# Patient Record
Sex: Female | Born: 1985 | Race: White | Hispanic: No | Marital: Married | Smoking: Former smoker
Health system: Southern US, Community
[De-identification: ages and names within clinical notes are randomized; demographics above are authoritative.]

## PROBLEM LIST (undated history)

## (undated) DIAGNOSIS — D6800 Von Willebrand disease, unspecified: Secondary | ICD-10-CM

## (undated) DIAGNOSIS — F32A Depression, unspecified: Secondary | ICD-10-CM

## (undated) DIAGNOSIS — F329 Major depressive disorder, single episode, unspecified: Secondary | ICD-10-CM

## (undated) HISTORY — DX: Von Willebrand disease, unspecified: D68.00

## (undated) HISTORY — DX: Von Willebrand's disease: D68.0

## (undated) HISTORY — PX: TONSILLECTOMY: SUR1361

## (undated) HISTORY — DX: Major depressive disorder, single episode, unspecified: F32.9

## (undated) HISTORY — DX: Depression, unspecified: F32.A

---
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
## 2004-11-29 ENCOUNTER — Inpatient Hospital Stay (HOSPITAL_COMMUNITY): Admission: AD | Admit: 2004-11-29 | Discharge: 2004-11-29 | Payer: Self-pay | Admitting: Obstetrics & Gynecology

## 2005-03-14 ENCOUNTER — Other Ambulatory Visit: Admission: RE | Admit: 2005-03-14 | Discharge: 2005-03-14 | Payer: Self-pay | Admitting: Gynecology

## 2005-08-10 ENCOUNTER — Other Ambulatory Visit: Admission: RE | Admit: 2005-08-10 | Discharge: 2005-08-10 | Payer: Self-pay | Admitting: Obstetrics and Gynecology

## 2005-09-04 ENCOUNTER — Ambulatory Visit: Payer: Self-pay | Admitting: Hematology & Oncology

## 2005-10-26 ENCOUNTER — Ambulatory Visit: Payer: Self-pay | Admitting: Hematology & Oncology

## 2005-10-27 LAB — IVY BLEEDING TIME: Bleeding Time: 5.5 Minutes (ref 2.0–8.0)

## 2005-11-01 LAB — VON WILLEBRAND FACTOR MULTIMER
Ristocetin-Cofactor: 87 % (ref 50–150)
Von Willebrand Ag: 157 % normal — ABNORMAL HIGH (ref 60–150)
Von Willebrand Multimers: NORMAL

## 2006-02-01 ENCOUNTER — Ambulatory Visit: Payer: Self-pay | Admitting: Hematology & Oncology

## 2006-02-05 LAB — CBC WITH DIFFERENTIAL/PLATELET
BASO%: 0.2 % (ref 0.0–2.0)
Basophils Absolute: 0 10*3/uL (ref 0.0–0.1)
EOS%: 0.9 % (ref 0.0–7.0)
Eosinophils Absolute: 0.1 10*3/uL (ref 0.0–0.5)
HCT: 35.5 % (ref 34.8–46.6)
HGB: 12.1 g/dL (ref 11.6–15.9)
LYMPH%: 13.9 % — ABNORMAL LOW (ref 14.0–48.0)
MCH: 27.8 pg (ref 26.0–34.0)
MONO#: 0.8 10*3/uL (ref 0.1–0.9)
MONO%: 8.6 % (ref 0.0–13.0)
NEUT#: 6.7 10*3/uL — ABNORMAL HIGH (ref 1.5–6.5)
NEUT%: 76.4 % (ref 39.6–76.8)
Platelets: 271 10*3/uL (ref 145–400)
RBC: 4.35 10*6/uL (ref 3.70–5.32)
lymph#: 1.2 10*3/uL (ref 0.9–3.3)

## 2006-02-05 LAB — CHCC SMEAR

## 2006-02-09 LAB — APTT: aPTT: 29 seconds (ref 24–37)

## 2006-03-02 ENCOUNTER — Inpatient Hospital Stay (HOSPITAL_COMMUNITY): Admission: AD | Admit: 2006-03-02 | Discharge: 2006-03-04 | Payer: Self-pay | Admitting: Obstetrics and Gynecology

## 2006-05-03 ENCOUNTER — Ambulatory Visit: Payer: Self-pay | Admitting: Hematology & Oncology

## 2006-05-07 LAB — CBC WITH DIFFERENTIAL/PLATELET
BASO%: 0.3 % (ref 0.0–2.0)
HGB: 13.2 g/dL (ref 11.6–15.9)
LYMPH%: 20.6 % (ref 14.0–48.0)
MCV: 81.8 fL (ref 81.0–101.0)
Platelets: 290 10*3/uL (ref 145–400)
RDW: 15 % — ABNORMAL HIGH (ref 11.3–14.5)
WBC: 6.7 10*3/uL (ref 3.9–10.0)
lymph#: 1.4 10*3/uL (ref 0.9–3.3)

## 2006-05-12 LAB — VON WILLEBRAND FACTOR MULTIMER

## 2006-11-24 ENCOUNTER — Emergency Department (HOSPITAL_COMMUNITY): Admission: EM | Admit: 2006-11-24 | Discharge: 2006-11-24 | Payer: Self-pay | Admitting: Family Medicine

## 2009-02-22 ENCOUNTER — Inpatient Hospital Stay (HOSPITAL_COMMUNITY): Admission: AD | Admit: 2009-02-22 | Discharge: 2009-02-22 | Payer: Self-pay | Admitting: Obstetrics and Gynecology

## 2009-03-17 ENCOUNTER — Ambulatory Visit: Payer: Self-pay | Admitting: Oncology

## 2009-03-17 LAB — CBC WITH DIFFERENTIAL/PLATELET
BASO%: 0.2 % (ref 0.0–2.0)
Basophils Absolute: 0 10*3/uL (ref 0.0–0.1)
Eosinophils Absolute: 0 10*3/uL (ref 0.0–0.5)
HCT: 31.8 % — ABNORMAL LOW (ref 34.8–46.6)
HGB: 11.2 g/dL — ABNORMAL LOW (ref 11.6–15.9)
MCH: 28.9 pg (ref 25.1–34.0)
MCHC: 35.2 g/dL (ref 31.5–36.0)
MONO#: 0.4 10*3/uL (ref 0.1–0.9)
NEUT%: 71.9 % (ref 38.4–76.8)
RBC: 3.87 10*6/uL (ref 3.70–5.45)
lymph#: 1.1 10*3/uL (ref 0.9–3.3)

## 2009-03-17 LAB — IRON AND TIBC
%SAT: 14 % — ABNORMAL LOW (ref 20–55)
Iron: 60 ug/dL (ref 42–145)
UIBC: 357 ug/dL

## 2009-03-17 LAB — FERRITIN: Ferritin: 26 ng/mL (ref 10–291)

## 2009-04-15 ENCOUNTER — Ambulatory Visit (HOSPITAL_COMMUNITY): Admission: RE | Admit: 2009-04-15 | Discharge: 2009-04-15 | Payer: Self-pay | Admitting: Obstetrics and Gynecology

## 2009-07-29 ENCOUNTER — Ambulatory Visit: Payer: Self-pay | Admitting: Oncology

## 2009-08-02 LAB — CBC WITH DIFFERENTIAL/PLATELET
EOS%: 0.3 % (ref 0.0–7.0)
Eosinophils Absolute: 0 10*3/uL (ref 0.0–0.5)
HCT: 34.3 % — ABNORMAL LOW (ref 34.8–46.6)
LYMPH%: 15.3 % (ref 14.0–49.7)
MCH: 28.1 pg (ref 25.1–34.0)
MCV: 83.8 fL (ref 79.5–101.0)
RDW: 14.5 % (ref 11.2–14.5)
lymph#: 1.2 10*3/uL (ref 0.9–3.3)

## 2009-08-06 LAB — VON WILLEBRAND PANEL: Von Willebrand Ag: 62 % normal (ref 61–164)

## 2009-09-02 ENCOUNTER — Inpatient Hospital Stay (HOSPITAL_COMMUNITY): Admission: AD | Admit: 2009-09-02 | Discharge: 2009-09-04 | Payer: Self-pay | Admitting: Obstetrics and Gynecology

## 2009-09-05 ENCOUNTER — Ambulatory Visit: Admission: RE | Admit: 2009-09-05 | Discharge: 2009-09-05 | Payer: Self-pay | Admitting: Obstetrics and Gynecology

## 2009-09-07 ENCOUNTER — Inpatient Hospital Stay (HOSPITAL_COMMUNITY): Admission: AD | Admit: 2009-09-07 | Discharge: 2009-09-07 | Payer: Self-pay | Admitting: Obstetrics and Gynecology

## 2009-09-11 ENCOUNTER — Inpatient Hospital Stay (HOSPITAL_COMMUNITY): Admission: AD | Admit: 2009-09-11 | Discharge: 2009-09-12 | Payer: Self-pay | Admitting: Obstetrics and Gynecology

## 2010-02-17 ENCOUNTER — Ambulatory Visit: Payer: Self-pay | Admitting: Internal Medicine

## 2010-02-17 DIAGNOSIS — R635 Abnormal weight gain: Secondary | ICD-10-CM | POA: Insufficient documentation

## 2010-02-17 DIAGNOSIS — E669 Obesity, unspecified: Secondary | ICD-10-CM | POA: Insufficient documentation

## 2010-02-17 LAB — CONVERTED CEMR LAB
ALT: 10 units/L (ref 0–35)
AST: 15 units/L (ref 0–37)
Albumin: 3.9 g/dL (ref 3.5–5.2)
BUN: 12 mg/dL (ref 6–23)
Basophils Absolute: 0 10*3/uL (ref 0.0–0.1)
CO2: 21 meq/L (ref 19–32)
Calcium: 9.1 mg/dL (ref 8.4–10.5)
Eosinophils Absolute: 0.1 10*3/uL (ref 0.0–0.7)
Eosinophils Relative: 1.3 % (ref 0.0–5.0)
GFR calc non Af Amer: 158.66 mL/min (ref >60)
Glucose, Bld: 71 mg/dL (ref 70–99)
HCT: 39.1 % (ref 36.0–46.0)
Lymphocytes Relative: 24.8 % (ref 12.0–46.0)
Lymphs Abs: 1.6 10*3/uL (ref 0.7–4.0)
MCHC: 34.1 g/dL (ref 30.0–36.0)
Neutro Abs: 4.1 10*3/uL (ref 1.4–7.7)
Neutrophils Relative %: 65.3 % (ref 43.0–77.0)
Potassium: 4.1 meq/L (ref 3.5–5.1)
RDW: 12.6 % (ref 11.5–14.6)
Sodium: 139 meq/L (ref 135–145)
T4, Total: 9.6 ug/dL (ref 5.0–12.5)
TSH: 2.32 microintl units/mL (ref 0.35–5.50)
Total Bilirubin: 0.6 mg/dL (ref 0.3–1.2)

## 2010-02-18 ENCOUNTER — Ambulatory Visit: Payer: Self-pay | Admitting: Internal Medicine

## 2010-02-18 DIAGNOSIS — R519 Headache, unspecified: Secondary | ICD-10-CM | POA: Insufficient documentation

## 2010-02-18 DIAGNOSIS — F411 Generalized anxiety disorder: Secondary | ICD-10-CM | POA: Insufficient documentation

## 2010-02-18 DIAGNOSIS — R51 Headache: Secondary | ICD-10-CM

## 2010-08-02 NOTE — Assessment & Plan Note (Signed)
Summary: FU/NWS   Vital Signs:  Patient profile:   25 year old female Menstrual status:  regular Height:      65 inches Weight:      171 pounds BMI:     28.56 O2 Sat:      97 % on Room air Temp:     98.6 degrees F oral Pulse rate:   70 / minute Pulse rhythm:   regular Resp:     16 per minute BP sitting:   104 / 60  (left arm) Cuff size:   large  Vitals Entered By: Rock Nephew CMA (February 18, 2010 3:25 PM)  Nutrition Counseling: Patient's BMI is greater than 25 and therefore counseled on weight management options.  O2 Flow:  Room air  Primary Care Provider:  Yetta Barre   History of Present Illness: She returns to disucss her lab results (all normal) and to talk about weight loss regimen, she offers no complaints today.  Preventive Screening-Counseling & Management  Alcohol-Tobacco     Alcohol drinks/day: <1     Alcohol type: wine     >5/day in last 3 mos: no     Alcohol Counseling: not indicated; use of alcohol is not excessive or problematic     Feels need to cut down: no     Feels annoyed by complaints: no     Feels guilty re: drinking: no     Needs 'eye opener' in am: no     Smoking Status: current     Smoking Cessation Counseling: yes     Smoke Cessation Stage: contemplative     Packs/Day: <0.25     Year Started: 1998     Pack years: 3     Tobacco Counseling: to quit use of tobacco products  Hep-HIV-STD-Contraception     Hepatitis Risk: no risk noted     HIV Risk: no risk noted     STD Risk: no risk noted  Clinical Review Panels:  Diabetes Management   Creatinine:  0.5 (02/17/2010)  CBC   WBC:  6.3 (02/17/2010)   RBC:  4.63 (02/17/2010)   Hgb:  13.3 (02/17/2010)   Hct:  39.1 (02/17/2010)   Platelets:  239.0 (02/17/2010)   MCV  84.5 (02/17/2010)   MCHC  34.1 (02/17/2010)   RDW  12.6 (02/17/2010)   PMN:  65.3 (02/17/2010)   Lymphs:  24.8 (02/17/2010)   Monos:  8.4 (02/17/2010)   Eosinophils:  1.3 (02/17/2010)   Basophil:  0.2  (02/17/2010)  Complete Metabolic Panel   Glucose:  71 (02/17/2010)   Sodium:  139 (02/17/2010)   Potassium:  4.1 (02/17/2010)   Chloride:  107 (02/17/2010)   CO2:  21 (02/17/2010)   BUN:  12 (02/17/2010)   Creatinine:  0.5 (02/17/2010)   Albumin:  3.9 (02/17/2010)   Total Protein:  7.0 (02/17/2010)   Calcium:  9.1 (02/17/2010)   Total Bili:  0.6 (02/17/2010)   Alk Phos:  67 (02/17/2010)   SGPT (ALT):  10 (02/17/2010)   SGOT (AST):  15 (02/17/2010)   Problems Prior to Update: 1)  Family History Depression  (ICD-V17.0) 2)  Headache  (ICD-784.0) 3)  Anxiety  (ICD-300.00) 4)  Obesity  (ICD-278.00) 5)  Weight Gain  (ICD-783.1)  Medications Prior to Update: 1)  Bcp  Current Medications (verified): 1)  Bcp  Allergies (verified): No Known Drug Allergies  Past History:  Past Medical History: Last updated: 02/17/2010 Anxiety Headache VonWillebrand bleeding disorder  Past Surgical History: Last updated: 02/17/2010 Tonsillectomy  Family History: Last updated: 02/17/2010 Family History Depression Family History High cholesterol Family History Hypertension  Social History: Last updated: 02/17/2010 Occupation: Special Ed. Teacher Married Current Smoker Alcohol use-yes Drug use-no Regular exercise-yes  Risk Factors: Alcohol Use: <1 (02/18/2010) >5 drinks/d w/in last 3 months: no (02/18/2010) Exercise: yes (02/17/2010)  Risk Factors: Smoking Status: current (02/18/2010) Packs/Day: <0.25 (02/18/2010)  Family History: Reviewed history from 02/17/2010 and no changes required. Family History Depression Family History High cholesterol Family History Hypertension  Social History: Reviewed history from 02/17/2010 and no changes required. Occupation: Special Ed. Teacher Married Current Smoker Alcohol use-yes Drug use-no Regular exercise-yes  Review of Systems       The patient complains of weight gain.  The patient denies anorexia, weight loss, chest  pain, abdominal pain, hematuria, difficulty walking, depression, and enlarged lymph nodes.    Physical Exam  General:  alert, well-developed, well-nourished, well-hydrated, appropriate dress, normal appearance, healthy-appearing, cooperative to examination, and good hygiene.   Mouth:  good dentition, no gingival abnormalities, no dental plaque, and pharynx pink and moist.   Neck:  supple, full ROM, no masses, no thyromegaly, no JVD, normal carotid upstroke, no carotid bruits, and no cervical lymphadenopathy.   Lungs:  normal respiratory effort, no intercostal retractions, no accessory muscle use, normal breath sounds, no dullness, no fremitus, no crackles, and no wheezes.   Heart:  normal rate, regular rhythm, no murmur, no gallop, no rub, and no JVD.   Abdomen:  soft, non-tender, normal bowel sounds, no distention, no masses, no guarding, no rigidity, no rebound tenderness, no abdominal hernia, no inguinal hernia, no hepatomegaly, and no splenomegaly.   Msk:  normal ROM, no joint tenderness, no joint swelling, no joint warmth, no redness over joints, no joint deformities, no joint instability, and no crepitation.   Extremities:  No clubbing, cyanosis, edema, or deformity noted with normal full range of motion of all joints.   Neurologic:  No cranial nerve deficits noted. Station and gait are normal. Plantar reflexes are down-going bilaterally. DTRs are symmetrical throughout. Sensory, motor and coordinative functions appear intact. Skin:  turgor normal, color normal, no rashes, no suspicious lesions, no ecchymoses, no petechiae, no purpura, no ulcerations, and no edema.   Psych:  Cognition and judgment appear intact. Alert and cooperative with normal attention span and concentration. No apparent delusions, illusions, hallucinations   Impression & Recommendations:  Problem # 1:  OBESITY (ICD-278.00) Assessment Unchanged  nutrition consult and exercise  Ht: 65 (02/18/2010)   Wt: 171  (02/18/2010)   BMI: 28.56 (02/18/2010)  Complete Medication List: 1)  Bcp   Patient Instructions: 1)  Please schedule a follow-up appointment in 3 months. 2)  It is important that you exercise regularly at least 20 minutes 5 times a week. If you develop chest pain, have severe difficulty breathing, or feel very tired , stop exercising immediately and seek medical attention. 3)  You need to lose weight. Consider a lower calorie diet and regular exercise.

## 2010-08-02 NOTE — Assessment & Plan Note (Signed)
Summary: NEW / [employer] / # CD   Vital Signs:  Patient profile:   25 year old female Menstrual status:  regular LMP:     12/01/2009 Height:      65 inches Weight:      171 pounds BMI:     28.56 O2 Sat:      98 % on Room air Temp:     98.7 degrees F oral Pulse rate:   87 / minute Pulse rhythm:   regular Resp:     16 per minute BP sitting:   110 / 64  (left arm) Cuff size:   large  Vitals Entered By: Rock Nephew CMA (February 17, 2010 4:21 PM)  Nutrition Counseling: Patient's BMI is greater than 25 and therefore counseled on weight management options.  O2 Flow:  Room air CC: weight gain Is Patient Diabetic? No Pain Assessment Patient in pain? no      LMP (date): 12/01/2009 LMP - Character: normal LMP - Reliable? approximate (month known)     Menstrual Status regular Enter LMP: 12/01/2009   Primary Care Provider:  Yetta Barre  CC:  weight gain.  History of Present Illness: New to me she complains that she has not been able to lose weight. 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 She is now 5 months post-partum with a healthy boy and wants to get some weight off. She does not report being very serious with calorie intake and exercise. She wants to know if an underactive thyroid gland can be implicated.  Preventive Screening-Counseling & Management  Alcohol-Tobacco     Alcohol drinks/day: <1     Alcohol type: wine     >5/day in last 3 mos: no     Alcohol Counseling: not indicated; use of alcohol is not excessive or problematic     Feels need to cut down: no     Feels annoyed by complaints: no     Feels guilty re: drinking: no     Needs 'eye opener' in am: no     Smoking Status: current     Smoking Cessation Counseling: yes     Smoke Cessation Stage: contemplative     Packs/Day: <0.25     Year Started: 1998     Pack years: 3     Tobacco Counseling: to quit use of tobacco  products  Caffeine-Diet-Exercise     Does Patient Exercise: yes  Hep-HIV-STD-Contraception     Hepatitis Risk: no risk noted     HIV Risk: no risk noted     STD Risk: no risk noted      Sexual History:  currently monogamous.        Drug Use:  no.        Blood Transfusions:  no.    Medications Prior to Update: 1)  None  Current Medications (verified): 1)  Bcp  Allergies (verified): No Known Drug Allergies  Past History:  Past Medical History: Anxiety Headache VonWillebrand bleeding disorder  Past Surgical History: Tonsillectomy  Family History: Family History Depression Family History High cholesterol Family History Hypertension  Social History: Occupation: Cabin crew Ed. Teacher Married Current Smoker Alcohol use-yes Drug use-no Regular exercise-yes Smoking Status:  current Packs/Day:  <0.25 Hepatitis Risk:  no risk noted HIV Risk:  no risk noted STD Risk:  no risk noted Sexual History:  currently monogamous Blood Transfusions:  no Drug Use:  no Does Patient Exercise:  yes  Review of Systems       The patient complains of weight gain.  The patient denies anorexia, fever, hoarseness, chest pain, syncope, dyspnea on exertion, peripheral edema, prolonged cough, headaches, hemoptysis, abdominal pain, suspicious skin lesions, difficulty walking, depression, angioedema, and breast masses.   GI:  Complains of constipation; denies abdominal pain, bloody stools, diarrhea, hemorrhoids, indigestion, loss of appetite, nausea, vomiting, vomiting blood, and yellowish skin color. Endo:  Complains of weight change; denies cold intolerance, excessive hunger, excessive thirst, excessive urination, heat intolerance, and polyuria.  Physical Exam  General:  alert, well-developed, well-nourished, well-hydrated, appropriate dress, normal appearance, healthy-appearing, cooperative to examination, and good hygiene.   Head:  normocephalic, atraumatic, no abnormalities observed,  and no abnormalities palpated.   Eyes:  vision grossly intact, pupils equal, pupils round, and pupils reactive to light.   Ears:  R ear normal and L ear normal.   Mouth:  good dentition, no gingival abnormalities, no dental plaque, and pharynx pink and moist.   Neck:  supple, full ROM, no masses, no thyromegaly, no JVD, normal carotid upstroke, no carotid bruits, and no cervical lymphadenopathy.   Lungs:  normal respiratory effort, no intercostal retractions, no accessory muscle use, normal breath sounds, no dullness, no fremitus, no crackles, and no wheezes.   Heart:  normal rate, regular rhythm, no murmur, no gallop, no rub, and no JVD.   Abdomen:  soft, non-tender, normal bowel sounds, no distention, no masses, no guarding, no rigidity, no rebound tenderness, no abdominal hernia, no inguinal hernia, no hepatomegaly, and no splenomegaly.   Msk:  normal ROM, no joint tenderness, no joint swelling, no joint warmth, no redness over joints, no joint deformities, no joint instability, and no crepitation.   Pulses:  R and L carotid,radial,femoral,dorsalis pedis and posterior tibial pulses are full and equal bilaterally Extremities:  No clubbing, cyanosis, edema, or deformity noted with normal full range of motion of all joints.   Neurologic:  No cranial nerve deficits noted. Station and gait are normal. Plantar reflexes are down-going bilaterally. DTRs are symmetrical throughout. Sensory, motor and coordinative functions appear intact. Skin:  turgor normal, color normal, no rashes, no suspicious lesions, no ecchymoses, no petechiae, no purpura, no ulcerations, and no edema.   Cervical Nodes:  no anterior cervical adenopathy and no posterior cervical adenopathy.   Axillary Nodes:  no R axillary adenopathy and no L axillary adenopathy.   Inguinal Nodes:  no R inguinal adenopathy and no L inguinal adenopathy.   Psych:  Cognition and judgment appear intact. Alert and cooperative with normal attention span  and concentration. No apparent delusions, illusions, hallucinations   Impression & Recommendations:  Problem # 1:  OBESITY (ICD-278.00) Assessment New  Orders: Nutrition Referral (Nutrition)  Ht: 65 (02/17/2010)   Wt: 171 (02/17/2010)   BMI: 28.56 (02/17/2010)  Problem # 2:  WEIGHT GAIN (ICD-783.1) Assessment: New will look for secondary causes Orders: Venipuncture (16109) TLB-BMP (Basic Metabolic Panel-BMET) (80048-METABOL) TLB-CBC Platelet - w/Differential (85025-CBCD) TLB-Hepatic/Liver Function Pnl (80076-HEPATIC) TLB-TSH (Thyroid Stimulating Hormone) (84443-TSH) TLB-T4 (Thyrox), Total (84436-T4) TLB-T3, Free (Triiodothyronine) (84481-T3FREE)  Complete Medication List: 1)  Bcp   Patient Instructions: 1)  Please schedule a follow-up appointment as needed. 2)  It is important that you exercise regularly at least 20 minutes 5 times a week. If you develop chest pain, have severe difficulty breathing, or feel very tired , stop exercising immediately and seek medical attention. 3)  You need to lose weight. Consider  a lower calorie diet and regular exercise.

## 2010-09-22 IMAGING — CT CT ABD-PELV W/ CM
1 of 2 series · 16 of 32 positions shown, 20 images · IV contrast (OMNIPAQUE)
Comparison: 09/11/2009 pelvic ultrasound

CLINICAL DATA: Abdominal and right pelvic pain.  The patient is 1
week postpartum.

CT ABDOMEN AND PELVIS WITH CONTRAST
TECHNIQUE: Multidetector CT imaging of the abdomen and pelvis was
performed following the standard protocol during bolus
administration of intravenous contrast.
Contrast: 125 ml intravenous [id number]

[Series 2: routine abdomen/pelvis with · axial · 0.83mm/px · z∈[-435,-25]mm · 16 of 90 slices shown, 20 images]
[im 4/90  soft-tissue]
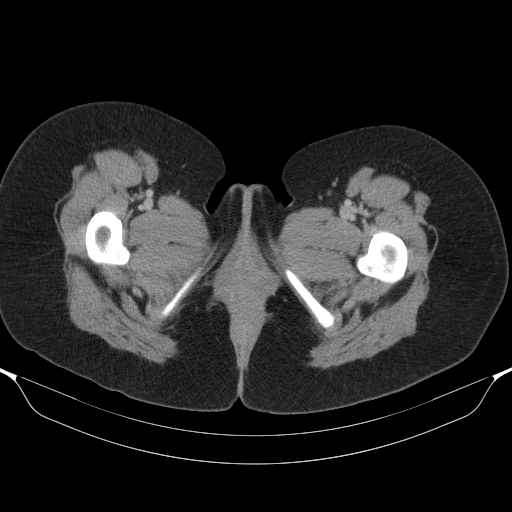
[im 4/90  bone]
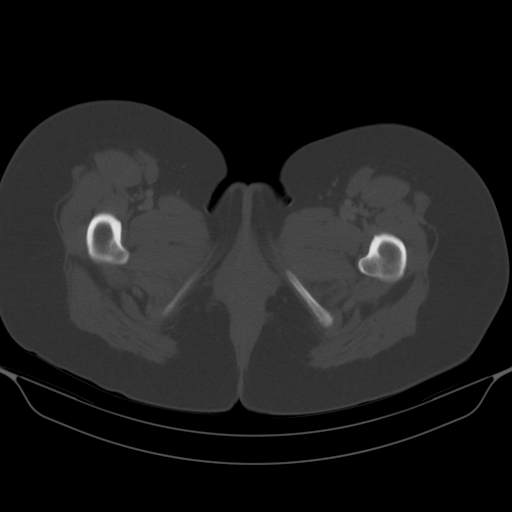
[im 12/90  soft-tissue]
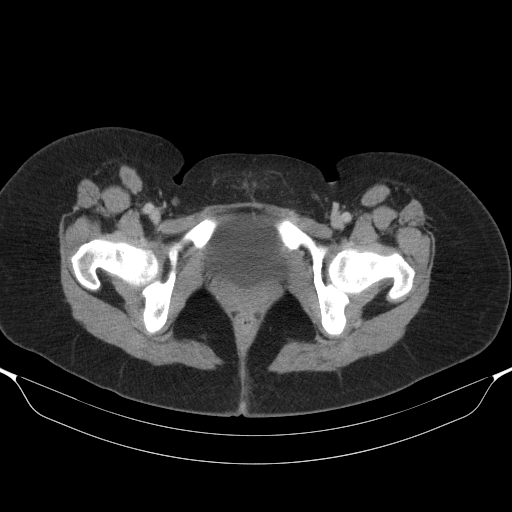
[im 19/90  soft-tissue]
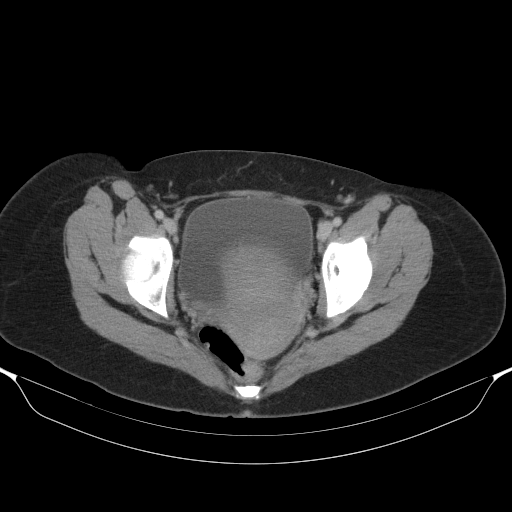
[im 23/90  soft-tissue]
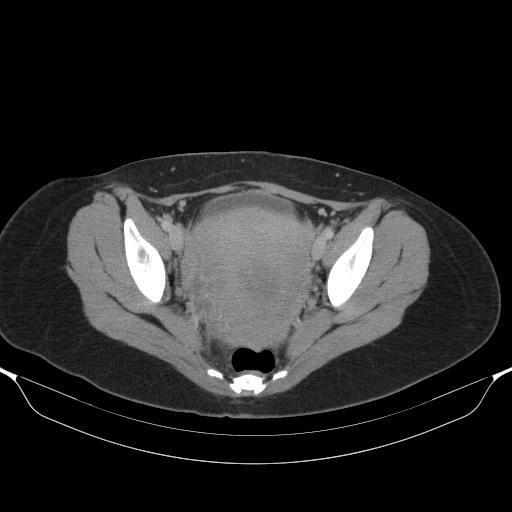
[im 30/90  soft-tissue]
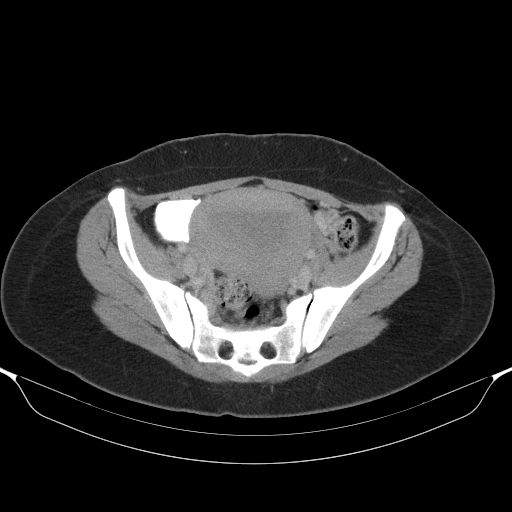
[im 38/90  soft-tissue]
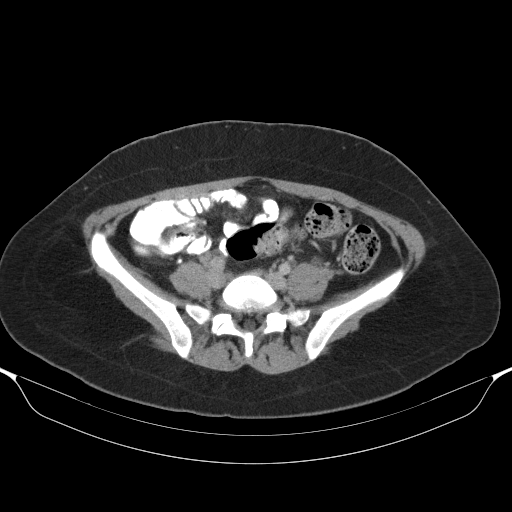
[im 41/90  soft-tissue]
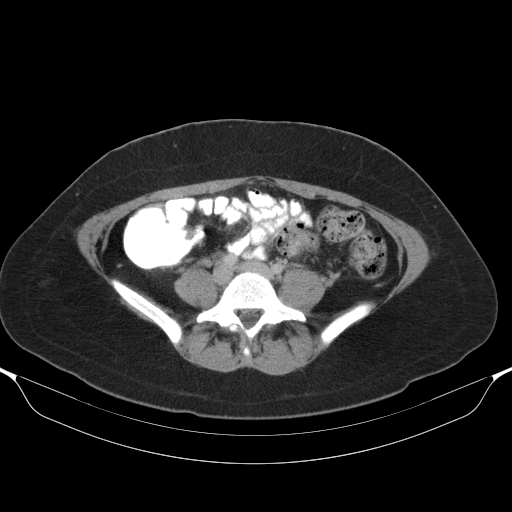
[im 49/90  soft-tissue]
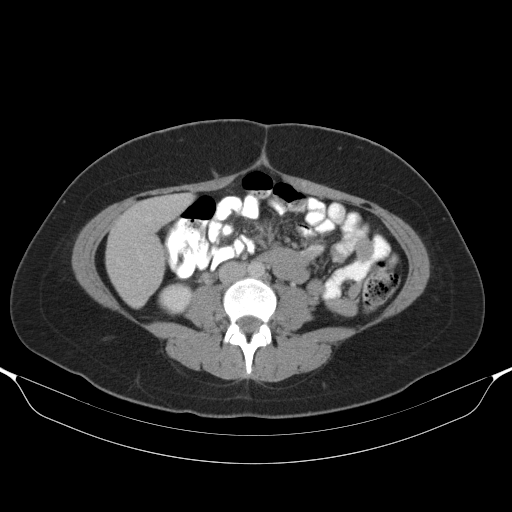
[im 52/90  soft-tissue]
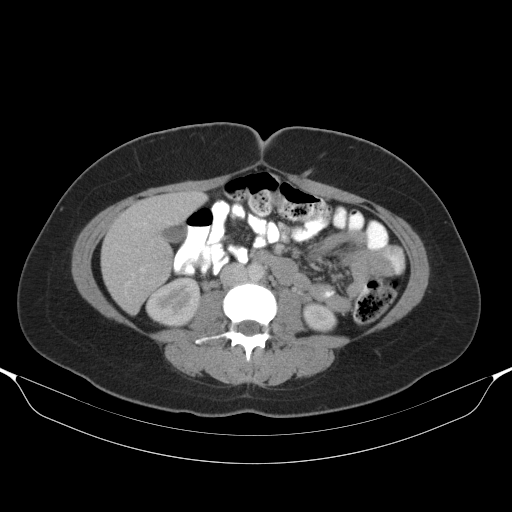
[im 52/90  bone]
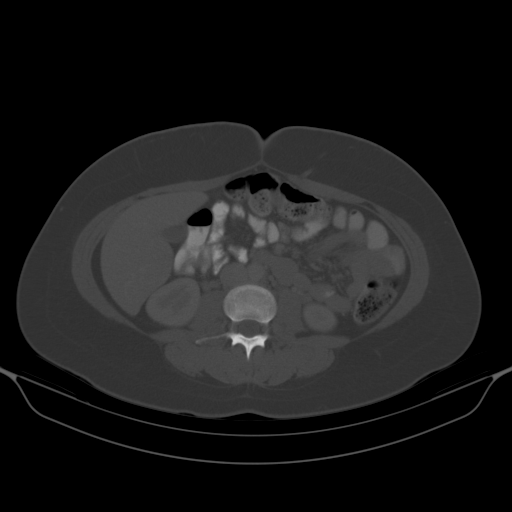
[im 60/90  soft-tissue]
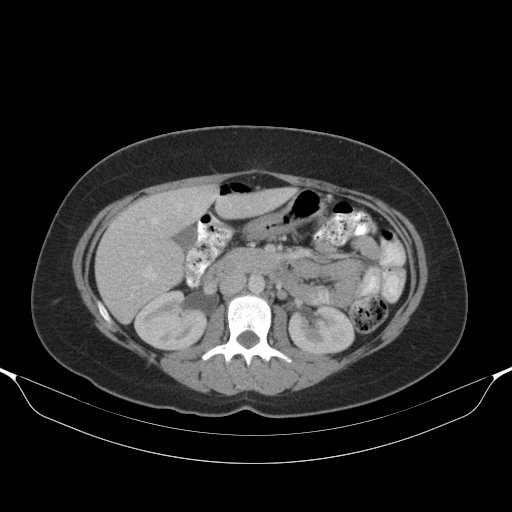
[im 67/90  soft-tissue]
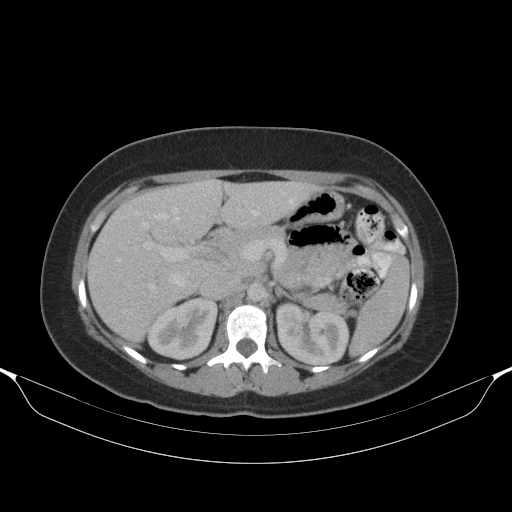
[im 71/90  soft-tissue]
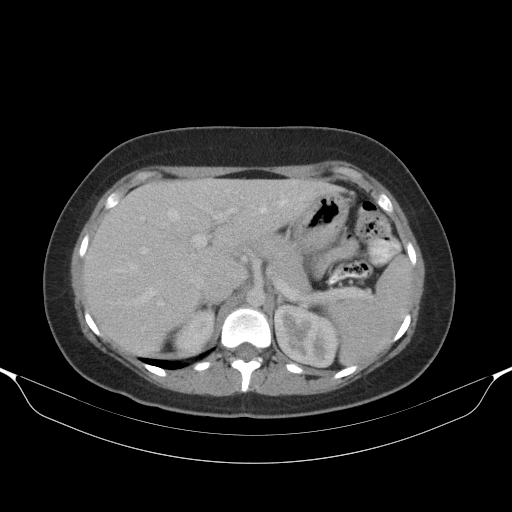
[im 75/90  lung]
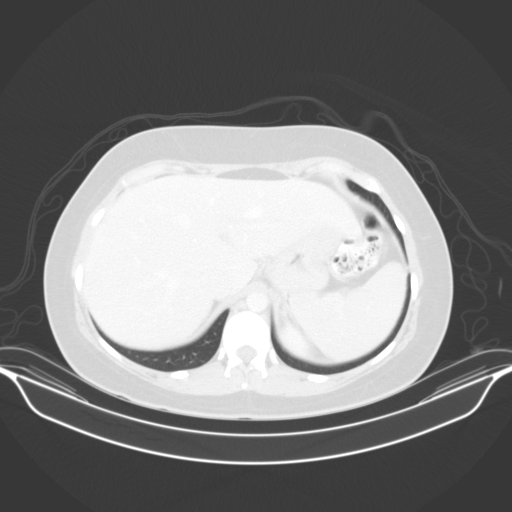
[im 78/90  soft-tissue]
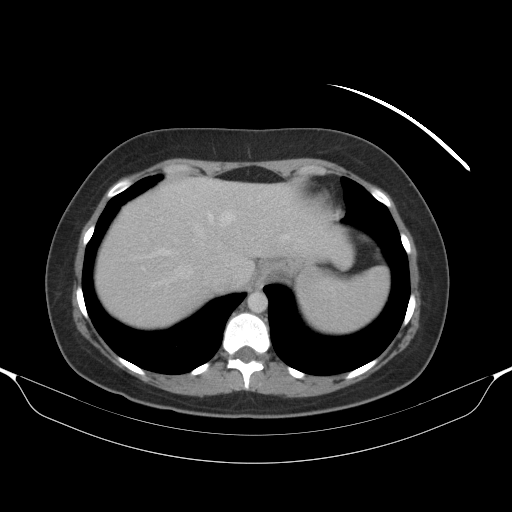
[im 78/90  lung]
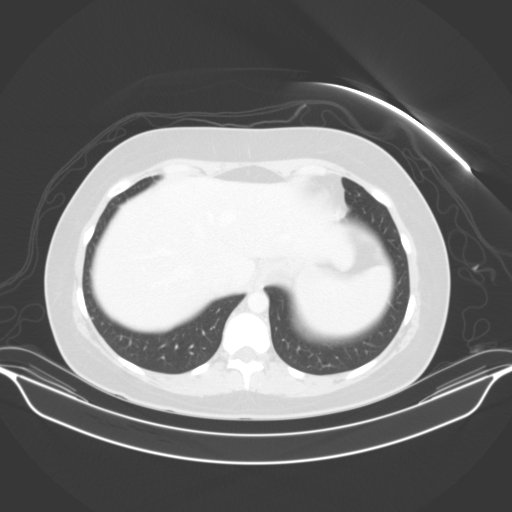
[im 82/90  lung]
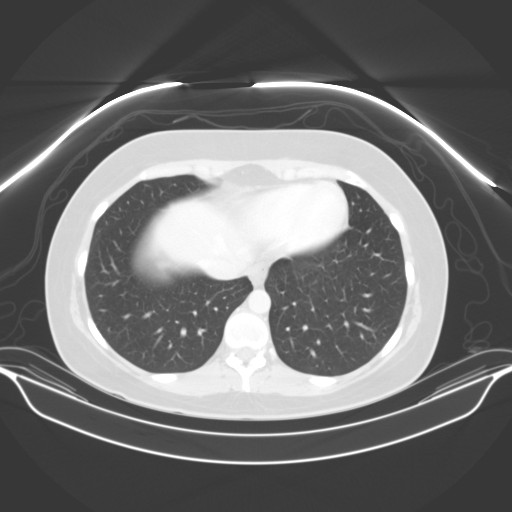
[im 86/90  soft-tissue]
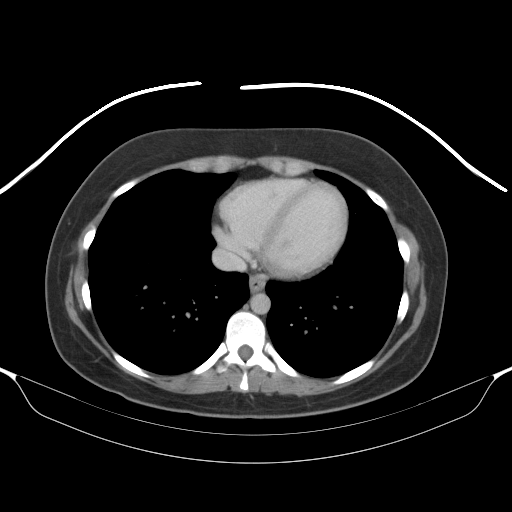
[im 86/90  lung]
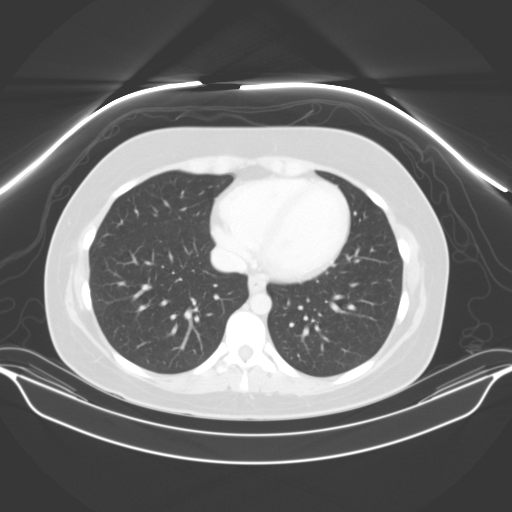

[16 of 32 positions shown; findings below may reference images not displayed]

FINDINGS: The liver, spleen, adrenal glands, kidneys, gallbladder
and pancreas are unremarkable.
An enlarged uterus is compatible with recent postpartum state.
No free fluid, enlarged lymph nodes, biliary dilation or abdominal
aortic aneurysm identified.
No definite urinary calculi are identified.
The appendix and bowel are unremarkable.
No acute or suspicious bony abnormalities are identified.
IMPRESSION: No evidence of acute abnormality or cause for this patient's pain.

Enlarged uterus compatible with recent postpartum state.

Normal appendix.

## 2010-09-25 LAB — CBC
HCT: 35.7 % — ABNORMAL LOW (ref 36.0–46.0)
HCT: 37.1 % (ref 36.0–46.0)
Hemoglobin: 10.6 g/dL — ABNORMAL LOW (ref 12.0–15.0)
Hemoglobin: 12 g/dL (ref 12.0–15.0)
Hemoglobin: 13.2 g/dL (ref 12.0–15.0)
MCHC: 33 g/dL (ref 30.0–36.0)
MCHC: 33.4 g/dL (ref 30.0–36.0)
MCHC: 33.6 g/dL (ref 30.0–36.0)
MCHC: 33.9 g/dL (ref 30.0–36.0)
MCV: 83.3 fL (ref 78.0–100.0)
MCV: 83.8 fL (ref 78.0–100.0)
MCV: 84.3 fL (ref 78.0–100.0)
Platelets: 154 10*3/uL (ref 150–400)
Platelets: 183 10*3/uL (ref 150–400)
Platelets: 266 10*3/uL (ref 150–400)
Platelets: 288 10*3/uL (ref 150–400)
RBC: 3.8 MIL/uL — ABNORMAL LOW (ref 3.87–5.11)
RBC: 4.28 MIL/uL (ref 3.87–5.11)
RBC: 4.4 MIL/uL (ref 3.87–5.11)
RBC: 4.58 MIL/uL (ref 3.87–5.11)
RDW: 14.1 % (ref 11.5–15.5)
RDW: 14.6 % (ref 11.5–15.5)
WBC: 4.6 10*3/uL (ref 4.0–10.5)
WBC: 9.2 10*3/uL (ref 4.0–10.5)

## 2010-09-25 LAB — DIFFERENTIAL
Basophils Relative: 0 % (ref 0–1)
Eosinophils Relative: 1 % (ref 0–5)
Lymphs Abs: 1.3 10*3/uL (ref 0.7–4.0)
Neutro Abs: 7.5 10*3/uL (ref 1.7–7.7)
Neutrophils Relative %: 80 % — ABNORMAL HIGH (ref 43–77)

## 2010-09-25 LAB — URINALYSIS, ROUTINE W REFLEX MICROSCOPIC
Glucose, UA: NEGATIVE mg/dL
Glucose, UA: NEGATIVE mg/dL
Hgb urine dipstick: NEGATIVE
Hgb urine dipstick: NEGATIVE
Ketones, ur: NEGATIVE mg/dL
Urobilinogen, UA: 0.2 mg/dL (ref 0.0–1.0)
pH: 5 (ref 5.0–8.0)

## 2010-09-25 LAB — URINE CULTURE
Colony Count: NO GROWTH
Culture: NO GROWTH

## 2010-09-25 LAB — COMPREHENSIVE METABOLIC PANEL
ALT: 66 U/L — ABNORMAL HIGH (ref 0–35)
Albumin: 3 g/dL — ABNORMAL LOW (ref 3.5–5.2)
Alkaline Phosphatase: 101 U/L (ref 39–117)
CO2: 25 mEq/L (ref 19–32)
GFR calc non Af Amer: >60 mL/min (ref >60)
Glucose, Bld: 77 mg/dL (ref 70–99)
Sodium: 139 mEq/L (ref 135–145)
Total Protein: 6.7 g/dL (ref 6.0–8.3)

## 2010-09-25 LAB — RPR: RPR Ser Ql: NONREACTIVE

## 2010-09-25 LAB — URINE MICROSCOPIC-ADD ON

## 2010-11-18 NOTE — Discharge Summary (Signed)
Vanessa [name], Vanessa [name]                  ACCOUNT NO.:  [id number]   MEDICAL RECORD NO.:  [id number]          PATIENT TYPE:  INP   LOCATION:  9140                          FACILITY:  [facility]   PHYSICIAN:  Zenaida Niece, M.D.DATE OF BIRTH:  02/04/1986   DATE OF ADMISSION:  03/02/2006  DATE OF DISCHARGE:  03/04/2006                                 DISCHARGE SUMMARY   ADMISSION DIAGNOSES:  1. Intrauterine pregnancy at 40 weeks.  2. Mild von Willebrand's disease.   DISCHARGE DIAGNOSES:  1. Intrauterine pregnancy at 40 weeks.  2. Mild von Willebrand's disease.   PROCEDURE:  On August 31, she had a spontaneous vaginal delivery.   HISTORY OF PRESENT ILLNESS:  This is a 25 year old, white female G1, para 0  with an EGA of 40+ weeks who presents with complaint of spontaneous rupture  of membranes at 0-100 on the day of admission.  On initial evaluation, she  was 4-5 cm dilated, completely effaced and 0 station.   PRENATAL CARE:  Complicated by history of mild von Willebrand's disease.  She was seen by a hematologist this pregnancy with normal factor VIII levels  and it was not felt that she would need any DDAVP for delivery.   PAST MEDICAL HISTORY:  Past history is otherwise uncomplicated.   PRENATAL LABORATORY DATA:  Blood type is A+ with a negative antibody screen,  RPR nonreactive, rubella immune, hepatitis B surface antigen negative, HIV  negative, gonorrhea and chlamydia negative, Group B Streptococcus is  negative, 1-hour Glucola 105.   PHYSICAL EXAMINATION:  VITAL SIGNS:  Afebrile with stable vital signs.  Fetal heart tracing is reassuring.  ABDOMEN:  Gravid, nontender.  Cervix on Dr. Berenda Morale first exam is 6+,  complete and 0.   HOSPITAL COURSE:  The patient was admitted and continued to contract on her  own.  She progressed to complete, pushed well and on the morning of August  31, had a vaginal delivery of a viable female infant with Apgar's of 8 and 9,  that weighed 7 pounds  15 ounces.  Placenta delivered spontaneous and was  intact.  She had a right periurethral laceration which was hemostatic and a  small first-degree laceration repaired with 2-0 Vicryl.  Estimated blood  loss was 350 mL.  Postpartum, she had no significant complications.  Predelivery hemoglobin 12.4, postdelivery 10.7.  On postpartum day #2, she  was felt to be stable enough for discharge home.   DIET:  Regular diet.   ACTIVITY:  Pelvic rest.   FOLLOW UP:  Follow up in 6 weeks.   MEDICATIONS:  1. Percocet #20, one to two p.o. q.4-6 hours p.r.n. pain.  2. Over-the-counter ibuprofen as needed.   SPECIAL INSTRUCTIONS:  She is given our discharge pamphlet.      Zenaida Niece, M.D.  Electronically Signed     TDM/MEDQ  D:  03/04/2006  T:  03/05/2006  Job:  [id number]

## 2013-06-19 LAB — OB RESULTS CONSOLE GC/CHLAMYDIA
CHLAMYDIA, DNA PROBE: NEGATIVE
GC PROBE AMP, GENITAL: NEGATIVE

## 2013-06-19 LAB — OB RESULTS CONSOLE RPR: RPR: NONREACTIVE

## 2013-06-19 LAB — OB RESULTS CONSOLE HEPATITIS B SURFACE ANTIGEN: HEP B S AG: NEGATIVE

## 2013-06-19 LAB — OB RESULTS CONSOLE ABO/RH: RH Type: POSITIVE

## 2013-06-19 LAB — OB RESULTS CONSOLE ANTIBODY SCREEN: ANTIBODY SCREEN: NEGATIVE

## 2013-06-19 LAB — OB RESULTS CONSOLE HIV ANTIBODY (ROUTINE TESTING): HIV: NONREACTIVE

## 2013-06-19 LAB — OB RESULTS CONSOLE RUBELLA ANTIBODY, IGM: Rubella: NON-IMMUNE/NOT IMMUNE

## 2013-07-03 NOTE — L&D Delivery Note (Signed)
Delivery Note Pt reached complete dilation and pushed great.  At 3:14 PM a healthy female was delivered via Vaginal, Spontaneous Delivery (Presentation: ; Sacrum Posterior).  APGAR: , 9; weigh pendingt .   Placenta status: Intact, Spontaneous.  Cord: 3 vessels with the following complications: None.    Anesthesia: Epidural  Episiotomy: none Lacerations: 1st degree Suture Repair: 3.0 vicryl rapide Est. Blood Loss (mL): 300cc  Mom to postpartum.  Baby to Couplet care / Skin to Skin.  Oliver PilaICHARDSON,Vanessa [name] 01/21/2014, 4:13 PM

## 2013-09-19 ENCOUNTER — Inpatient Hospital Stay (HOSPITAL_COMMUNITY)
Admission: AD | Admit: 2013-09-19 | Payer: BC Managed Care – PPO | Source: Ambulatory Visit | Admitting: Obstetrics and Gynecology

## 2013-09-25 ENCOUNTER — Telehealth: Payer: Self-pay | Admitting: Hematology & Oncology

## 2013-09-25 NOTE — Telephone Encounter (Signed)
Left vm w NEW PATIENT today to remind them of their appointment with Dr. Ennever. Also, advised them to bring all meds and insurance information. ° °

## 2013-09-29 ENCOUNTER — Ambulatory Visit: Payer: BC Managed Care – PPO

## 2013-09-29 ENCOUNTER — Encounter: Payer: Self-pay | Admitting: Hematology & Oncology

## 2013-09-29 ENCOUNTER — Ambulatory Visit (HOSPITAL_BASED_OUTPATIENT_CLINIC_OR_DEPARTMENT_OTHER): Payer: BC Managed Care – PPO | Admitting: Hematology & Oncology

## 2013-09-29 ENCOUNTER — Other Ambulatory Visit (HOSPITAL_BASED_OUTPATIENT_CLINIC_OR_DEPARTMENT_OTHER): Payer: BC Managed Care – PPO | Admitting: Lab

## 2013-09-29 VITALS — BP 112/59 | HR 74 | Temp 97.6°F | Resp 14 | Ht 64.0 in | Wt 185.0 lb

## 2013-09-29 DIAGNOSIS — Z3490 Encounter for supervision of normal pregnancy, unspecified, unspecified trimester: Secondary | ICD-10-CM

## 2013-09-29 DIAGNOSIS — D6800 Von Willebrand disease, unspecified: Secondary | ICD-10-CM

## 2013-09-29 DIAGNOSIS — D6801 Von willebrand disease, type 1: Secondary | ICD-10-CM

## 2013-09-29 DIAGNOSIS — Z331 Pregnant state, incidental: Secondary | ICD-10-CM

## 2013-09-29 LAB — CBC WITH DIFFERENTIAL (CANCER CENTER ONLY)
BASO#: 0 10*3/uL (ref 0.0–0.2)
BASO%: 0 % (ref 0.0–2.0)
EOS ABS: 0.1 10*3/uL (ref 0.0–0.5)
EOS%: 0.8 % (ref 0.0–7.0)
HCT: 31.4 % — ABNORMAL LOW (ref 34.8–46.6)
HEMOGLOBIN: 10.7 g/dL — AB (ref 11.6–15.9)
LYMPH#: 1.2 10*3/uL (ref 0.9–3.3)
LYMPH%: 17.2 % (ref 14.0–48.0)
MCH: 27.7 pg (ref 26.0–34.0)
MCHC: 34.1 g/dL (ref 32.0–36.0)
MCV: 81 fL (ref 81–101)
MONO#: 0.5 10*3/uL (ref 0.1–0.9)
MONO%: 6.6 % (ref 0.0–13.0)
NEUT%: 75.4 % (ref 39.6–80.0)
NEUTROS ABS: 5.3 10*3/uL (ref 1.5–6.5)
Platelets: 217 10*3/uL (ref 145–400)
RBC: 3.86 10*6/uL (ref 3.70–5.32)
RDW: 13.5 % (ref 11.1–15.7)
WBC: 7.1 10*3/uL (ref 3.9–10.0)

## 2013-09-29 LAB — CHCC SATELLITE - SMEAR

## 2013-09-29 NOTE — Progress Notes (Signed)
Referral MD  Reason for Referral: Von Willebrand disease and pregnant   Chief Complaint  Patient presents with  . NEW PATIENT    Pt is [redacted] weeks pregnant, had several miscarriages and 1 child die of heart disease.  : I have von Willebrand disease disease. I am at 30 weeks.  HPI: [name] is a very charming 28 year old white female. I actually have seen her in the past. She has type I von Willebrand disease. She's had 2 prior pregnancies. These were uncomplicated. Her last pregnancy was 4 years ago. She's had 2 miscarriages between that pregnancy in this pregnancy.  She is at 25 weeks right now. So far, everything has been going well. She will be having a girl.  She apparently has placenta previa. There may be some suggestion of her needing a cesarean section.  Her today is July 13.  She's had no bleeding with the pregnancy. She's had no issues with rashes. There is no bruising. Said no cough. She's had no leg swelling. Her blood pressure has been well-controlled.  With her last pregnancies, her von Willebrand levels have been outstanding issues not needed any type of factor replacement.  We are asked to see her to help with any management issues regarding the von Willebrand's disease.   No past medical history on file.:  No past surgical history on file.:  Current outpatient prescriptions:Prenatal Vit-Fe Fumarate-FA (PRENATAL VITAMINS PLUS PO), Take by mouth every morning., Disp: , Rfl: :  :  No Known Allergies:  No family history on file.:  History   Social History  . Marital Status: Married    Spouse Name: N/A    Number of Children: N/A  . Years of Education: N/A   Occupational History  . Not on file.   Social History Main Topics  . Smoking status: Former Smoker -- 0.25 packs/day for 15 years    Types: Cigarettes    Start date: 04/01/1997    Quit date: 08/01/2012  . Smokeless tobacco: Never Used     Comment: quit 14 months ago  . Alcohol Use: Not on file  .  Drug Use: Not on file  . Sexual Activity: Not on file   Other Topics Concern  . Not on file   Social History Narrative  . No narrative on file  :  Pertinent items are noted in HPI.  Exam: @IPVITALS @  pregnant white female in no obvious distress. Her vital signs are temperature of 97.8. Pulse 74. Blood pressure 112/59. Weight is 185 pounds. Head and neck exam shows nodular or oral lesions. There are no palpable cervical or supraclavicular lymph nodes. Lungs are clear. Cardiac exam regular in rhythm with no murmurs rubs or bruits. Abdomen is soft. She's good bowel sounds. There is no fluid wave. There is no palpable abdominal mass. She's pregnant. There is no palpable hepato- splenomegaly.  Extremities shows no clubbing cyanosis or edema. Back exam no tenderness over the spine ribs or hips. Skin exam no rashes, ecchymosis or petechia. Neurological exam shows no focal neurological deficits.   Recent Labs  09/29/13 1458  WBC 7.1  HGB 10.7*  HCT 31.4*  PLT 217   No results found for this basename: NA, K, CL, CO2, GLUCOSE, BUN, CREATININE, CALCIUM,  in the last 72 hours  Blood smear review: Normochromic normocytic red blood cells. She has no target cells. There is no she's a size. There is no spherocytes. I see no nucleated red cells. There is no rouleau formation. White  cells are normal in morphology maturation. There is no immature myeloid lymphoid forms. Platelets are adequate number size. She is well granulated platelets.  Pathology: None     Assessment and Plan: Vanessa [name] is a very charming 28 year old white female. She has type I Willebrand's disease. She's had prior pregnancies without difficulties.  Am not sure if miscarriages would be related to follow brands. It's unusual to see miscarriages with von Willebrand's disease.  I don't think that we will have any problems with this pregnancy. Even if she needs a C-section, we should be okay as her von Willebrand levels should be  nice and high. Her factor VIII level will be critical. If this is about 50%, we should be in good shape.  I suppose that if she does have a C-section, appendectomy she delivers her baby girl, then von Willebrand levels might go down there might be some issues with bleeding and maybe wound healing. This would have to be watched closely.   I will like to get her back to see me in another 2-3 months. I want to make sure that we get her back soon enough so that there are any issues that we can address them.  The we see her back, I will get von Willebrand levels on her again.  I spent a good hour was Vanessa [name]. It was nice to see her again.3. I don't see her probably for about 4 years.

## 2013-10-05 LAB — APTT: aPTT: 33 seconds (ref 24–37)

## 2013-10-05 LAB — VON WILLEBRAND PANEL
Coagulation Factor VIII: 165 % — ABNORMAL HIGH (ref 73–140)
Ristocetin Co-factor, Plasma: 130 % (ref 42–200)
Von Willebrand Antigen, Plasma: 142 % (ref 50–217)

## 2013-10-07 ENCOUNTER — Telehealth: Payer: Self-pay | Admitting: *Deleted

## 2013-10-07 NOTE — Telephone Encounter (Addendum)
Message copied by Burnett CorrenteUMLEY, Ryla Cauthon L on Tue Oct 07, 2013  1:29 PM ------      Message from: Josph MachoENNEVER, PETER R      Created: Sun Oct 05, 2013  5:08 PM       Call - von willebrand levels are great!! Cindee LamePete ------Left voicemail informing patient that von willebrand levels are okay

## 2013-11-26 ENCOUNTER — Ambulatory Visit (HOSPITAL_BASED_OUTPATIENT_CLINIC_OR_DEPARTMENT_OTHER): Payer: BC Managed Care – PPO | Admitting: Hematology & Oncology

## 2013-11-26 ENCOUNTER — Other Ambulatory Visit: Payer: BC Managed Care – PPO | Admitting: Lab

## 2013-11-26 ENCOUNTER — Encounter: Payer: Self-pay | Admitting: Hematology & Oncology

## 2013-11-26 VITALS — BP 106/57 | HR 76 | Temp 98.7°F | Resp 14 | Ht 65.0 in | Wt 195.0 lb

## 2013-11-26 DIAGNOSIS — D6800 Von Willebrand disease, unspecified: Secondary | ICD-10-CM

## 2013-11-26 DIAGNOSIS — Z331 Pregnant state, incidental: Secondary | ICD-10-CM

## 2013-11-26 NOTE — Progress Notes (Signed)
Hematology and Oncology Follow Up Visit  LAKIVA GRANTHAM [id number] February 24, 1986 28 y.o. 11/26/2013   Principle Diagnosis:  Type 1 von Willebrand's and third trimester pregnancy Current Therapy:    Observation     Interim History:  Ms.  Mahmoud is in for a second office visit. We first saw her back in March. At that point in time, she had miscarriages in the past. She has type I followup brands. So far, her pregnancy has gone along quite well. She is now I think at 33 weeks. Her due date is July 13.  Or first saw her, we did her von Willebrand studies. She had very nice levels. Her von Willebrand antigen was 142%. Her factor VIII level was 165%. Her PTT was 33 seconds.  So far, her pregnancy has not been an issue. She's had no bleeding. There's been no leg swelling. She's had no cough. There's been no shortness of breath. She's had no nausea vomiting. She's still working. 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  Medications: Current outpatient prescriptions:Prenatal Vit-Fe Fumarate-FA (PRENATAL VITAMINS PLUS PO), Take by mouth every morning., Disp: , Rfl:   Allergies: No Known Allergies  Past Medical History, Surgical history, Social history, and Family History were reviewed and updated.  Review of Systems: As above  Physical Exam:  height is 5\' 5"  (1.651 m) and weight is 195 lb (88.451 kg). Her oral temperature is 98.7 F (37.1 C). Her blood pressure is 106/57 and her pulse is 76. Her respiration is 14.   Pregnant white female. Her head and neck exam shows no ocular or oral lesions. She has no palpable cervical or supraclavicular lymph nodes. Lungs are clear. Cardiac exam regular in rhythm with no murmurs rubs or bruits. Abdomen is pregnant. She has good bowel sounds. There is no fluid wave. She is no palpable liver or spleen. Extremities shows no clubbing cyanosis or edema. Neurological exam shows no focal neurological deficits. Skin exam no ecchymoses or petechia.  Lab Results  Component Value Date   WBC 7.1 09/29/2013   HGB 10.7* 09/29/2013   HCT 31.4* 09/29/2013   MCV 81 09/29/2013   PLT 217 09/29/2013     Chemistry      Component Value Date/Time   NA 139 02/17/2010 1639   K 4.1 02/17/2010 1639   CL 107 02/17/2010 1639   CO2 21 02/17/2010 1639   BUN 12 02/17/2010 1639   CREATININE 0.5 02/17/2010 1639      Component Value Date/Time   CALCIUM 9.1 02/17/2010 1639   ALKPHOS 67 02/17/2010 1639   AST 15 02/17/2010 1639   ALT 10 02/17/2010 1639   BILITOT 0.6 02/17/2010 1639         Impression and Plan: Ms. Risinger is a 28 year old white female. She has type I Willebrand's disease. She has a wonderful von Willebrand levels. I cannot imagine that she will have any problems with delivery. I think that as long as her factor VIII level is above 50, she should be with minimal bleeding risk.  I do want to see her back in about 3 or 4 weeks. She is due on July 13th.  I'm just very happy for her. She certainly is very outgoing and has a very good attitude and a good sense of humor.   Josph Macho, MD 5/27/20155:00 PM

## 2013-12-12 LAB — OB RESULTS CONSOLE GBS: STREP GROUP B AG: NEGATIVE

## 2013-12-22 ENCOUNTER — Encounter: Payer: Self-pay | Admitting: Hematology & Oncology

## 2013-12-22 ENCOUNTER — Ambulatory Visit (HOSPITAL_BASED_OUTPATIENT_CLINIC_OR_DEPARTMENT_OTHER): Payer: BC Managed Care – PPO | Admitting: Hematology & Oncology

## 2013-12-22 ENCOUNTER — Other Ambulatory Visit (HOSPITAL_BASED_OUTPATIENT_CLINIC_OR_DEPARTMENT_OTHER): Payer: BC Managed Care – PPO | Admitting: Lab

## 2013-12-22 VITALS — BP 116/63 | HR 72 | Temp 98.4°F | Resp 14 | Ht 65.0 in | Wt 198.0 lb

## 2013-12-22 DIAGNOSIS — D6801 Von willebrand disease, type 1: Secondary | ICD-10-CM

## 2013-12-22 DIAGNOSIS — D6800 Von Willebrand disease, unspecified: Secondary | ICD-10-CM

## 2013-12-22 DIAGNOSIS — Z3490 Encounter for supervision of normal pregnancy, unspecified, unspecified trimester: Secondary | ICD-10-CM

## 2013-12-22 DIAGNOSIS — O2690 Pregnancy related conditions, unspecified, unspecified trimester: Secondary | ICD-10-CM

## 2013-12-22 LAB — CBC WITH DIFFERENTIAL (CANCER CENTER ONLY)
BASO#: 0 10*3/uL (ref 0.0–0.2)
BASO%: 0.2 % (ref 0.0–2.0)
EOS%: 0.6 % (ref 0.0–7.0)
Eosinophils Absolute: 0 10*3/uL (ref 0.0–0.5)
HCT: 32.5 % — ABNORMAL LOW (ref 34.8–46.6)
HEMOGLOBIN: 11 g/dL — AB (ref 11.6–15.9)
LYMPH#: 1.1 10*3/uL (ref 0.9–3.3)
LYMPH%: 16.5 % (ref 14.0–48.0)
MCH: 27.6 pg (ref 26.0–34.0)
MCHC: 33.8 g/dL (ref 32.0–36.0)
MCV: 82 fL (ref 81–101)
MONO#: 0.5 10*3/uL (ref 0.1–0.9)
MONO%: 7.7 % (ref 0.0–13.0)
NEUT%: 75 % (ref 39.6–80.0)
NEUTROS ABS: 4.9 10*3/uL (ref 1.5–6.5)
Platelets: 183 10*3/uL (ref 145–400)
RBC: 3.99 10*6/uL (ref 3.70–5.32)
RDW: 13.8 % (ref 11.1–15.7)
WBC: 6.5 10*3/uL (ref 3.9–10.0)

## 2013-12-22 LAB — CHCC SATELLITE - SMEAR

## 2013-12-23 NOTE — Progress Notes (Signed)
Hematology and Oncology Follow Up Visit  Vanessa [name] [id number] [birth year] 28 y.o. 12/23/2013   Principle Diagnosis:  Type 1 von Willebrand's and third trimester pregnancy  Current Therapy:    Observation     Interim History:  Ms.  [name] is back for followup. She's still do well her pregnancy. She is due on July 13.  There's been no bleeding. Her baby is developing normally. It would be a baby girl.  She's had no rashes. There's been no bruising or bleeding. She's had a good appetite. She has had no cough or shortness of breath.  Her von Willebrand studies done in March looked fantastic. I am repeating them today. I just would not expect any problems with her delivery. After delivery, we will have to monitor her von Willebrand studies.  She sees her gynecologist later this week.    Medications: Current outpatient prescriptions:Prenatal Vit-Fe Fumarate-FA (PRENATAL VITAMINS PLUS PO), Take by mouth every morning., Disp: , Rfl:   Allergies: No Known Allergies  Past Medical History, Surgical history, Social history, and Family History were reviewed and updated.  Review of Systems: As above  Physical Exam:  height is 5\' 5"  (1.651 m) and weight is 198 lb (89.812 kg). Her oral temperature is 98.4 F (36.9 C). Her blood pressure is 116/63 and her pulse is 72. Her respiration is 14.   Pregnant white female. Lungs are clear. Cardiac exam regular rate and rhythm. Abdomen is pregnant but soft. There is no palpable liver or spleen tip. Extremities shows no clubbing cyanosis or edema. Neurological exam shows no focal deficits. Skin exam no rashes ecchymosis or petechia.  Lab Results  Component Value Date   WBC 6.5 12/22/2013   HGB 11.0* 12/22/2013   HCT 32.5* 12/22/2013   MCV 82 12/22/2013   PLT 183 12/22/2013     Chemistry      Component Value Date/Time   NA 139 02/17/2010 1639   K 4.1 02/17/2010 1639   CL 107 02/17/2010 1639   CO2 21 02/17/2010 1639   BUN 12 02/17/2010 1639   CREATININE 0.5 02/17/2010 1639      Component Value Date/Time   CALCIUM 9.1 02/17/2010 1639   ALKPHOS 67 02/17/2010 1639   AST 15 02/17/2010 1639   ALT 10 02/17/2010 1639   BILITOT 0.6 02/17/2010 1639         Impression and Plan: [name] is a 28 year old white female. She is in her third trimester of pregnancy. She has an Willebrand's disease. Again, her levels should still be nice and elevated. We will see what they are.  I just don't think we have to see her before her delivery. Again I just do not expect any problems with her delivery, whether it be a C-section or if she has a vaginal delivery. I would think that her von Willebrand levels will drop once she delivers.  I want to see her back probably 2- 3 weeks after her delivery.     Josph MachoENNEVER,PETER R, MD 6/23/20157:28 AM

## 2013-12-25 LAB — VON WILLEBRAND PANEL
Coagulation Factor VIII: 278 % — ABNORMAL HIGH (ref 73–140)
Ristocetin Co-factor, Plasma: 137 % (ref 42–200)
Von Willebrand Antigen, Plasma: 196 % (ref 50–217)

## 2013-12-25 LAB — APTT: aPTT: 32 seconds (ref 24–37)

## 2013-12-26 ENCOUNTER — Telehealth: Payer: Self-pay | Admitting: *Deleted

## 2013-12-26 NOTE — Telephone Encounter (Addendum)
Message copied by Burnett CorrenteUMLEY, Kanen Mottola L on Fri Dec 26, 2013 10:27 AM ------      Message from: Arlan OrganENNEVER, PETER R      Created: Thu Dec 25, 2013  4:16 PM       Please send to her ob/gyn!!!  Call patient and let her know that labs are ok!!  pete ------Informed pt that labs are good. Sent labs to her OBGYN.

## 2014-01-01 ENCOUNTER — Encounter (HOSPITAL_COMMUNITY): Payer: Self-pay | Admitting: *Deleted

## 2014-01-01 ENCOUNTER — Inpatient Hospital Stay (HOSPITAL_COMMUNITY)
Admission: AD | Admit: 2014-01-01 | Discharge: 2014-01-01 | Disposition: A | Discharge status: Home / Self Care | Payer: BC Managed Care – PPO | Source: Ambulatory Visit | Attending: Obstetrics and Gynecology | Admitting: Obstetrics and Gynecology

## 2014-01-01 DIAGNOSIS — O479 False labor, unspecified: Secondary | ICD-10-CM | POA: Insufficient documentation

## 2014-01-01 NOTE — MAU Note (Signed)
Pt states she has been having contractions that have increased in intensity for about 2hours

## 2014-01-01 NOTE — Discharge Instructions (Signed)
Third Trimester of Pregnancy °The third trimester is from week 29 through week 42, months 7 through 9. The third trimester is a time when the fetus is growing rapidly. At the end of the ninth month, the fetus is about 20 inches in length and weighs 6-10 pounds.  °BODY CHANGES °Your body goes through many changes during pregnancy. The changes vary from woman to woman.  °· Your weight will continue to increase. You can expect to gain 25-35 pounds (11-16 kg) by the end of the pregnancy. °· You may begin to get stretch marks on your hips, abdomen, and breasts. °· You may urinate more often because the fetus is moving lower into your pelvis and pressing on your bladder. °· You may develop or continue to have heartburn as a result of your pregnancy. °· You may develop constipation because certain hormones are causing the muscles that push waste through your intestines to slow down. °· You may develop hemorrhoids or swollen, bulging veins (varicose veins). °· You may have pelvic pain because of the weight gain and pregnancy hormones relaxing your joints between the bones in your pelvis. Backaches may result from overexertion of the muscles supporting your posture. °· You may have changes in your hair. These can include thickening of your hair, rapid growth, and changes in texture. Some women also have hair loss during or after pregnancy, or hair that feels dry or thin. Your hair will most likely return to normal after your baby is born. °· Your breasts will continue to grow and be tender. A yellow discharge may leak from your breasts called colostrum. °· Your belly button may stick out. °· You may feel short of breath because of your expanding uterus. °· You may notice the fetus "dropping," or moving lower in your abdomen. °· You may have a bloody mucus discharge. This usually occurs a few days to a week before labor begins. °· Your cervix becomes thin and soft (effaced) near your due date. °WHAT TO EXPECT AT YOUR PRENATAL  EXAMS  °You will have prenatal exams every 2 weeks until week 36. Then, you will have weekly prenatal exams. During a routine prenatal visit: °· You will be weighed to make sure you and the fetus are growing normally. °· Your blood pressure is taken. °· Your abdomen will be measured to track your baby's growth. °· The fetal heartbeat will be listened to. °· Any test results from the previous visit will be discussed. °· You may have a cervical check near your due date to see if you have effaced. °At around 36 weeks, your caregiver will check your cervix. At the same time, your caregiver will also perform a test on the secretions of the vaginal tissue. This test is to determine if a type of bacteria, Group B streptococcus, is present. Your caregiver will explain this further. °Your caregiver may ask you: °· What your birth plan is. °· How you are feeling. °· If you are feeling the baby move. °· If you have had any abnormal symptoms, such as leaking fluid, bleeding, severe headaches, or abdominal cramping. °· If you have any questions. °Other tests or screenings that may be performed during your third trimester include: °· Blood tests that check for low iron levels (anemia). °· Fetal testing to check the health, activity level, and growth of the fetus. Testing is done if you have certain medical conditions or if there are problems during the pregnancy. °FALSE LABOR °You may feel small, irregular contractions that   eventually go away. These are called Braxton Hicks contractions, or false labor. Contractions may last for hours, days, or even weeks before true labor sets in. If contractions come at regular intervals, intensify, or become painful, it is best to be seen by your caregiver.  °SIGNS OF LABOR  °· Menstrual-like cramps. °· Contractions that are 5 minutes apart or less. °· Contractions that start on the top of the uterus and spread down to the lower abdomen and back. °· A sense of increased pelvic pressure or back  pain. °· A watery or bloody mucus discharge that comes from the vagina. °If you have any of these signs before the 37th week of pregnancy, call your caregiver right away. You need to go to the hospital to get checked immediately. °HOME CARE INSTRUCTIONS  °· Avoid all smoking, herbs, alcohol, and unprescribed drugs. These chemicals affect the formation and growth of the baby. °· Follow your caregiver's instructions regarding medicine use. There are medicines that are either safe or unsafe to take during pregnancy. °· Exercise only as directed by your caregiver. Experiencing uterine cramps is a good sign to stop exercising. °· Continue to eat regular, healthy meals. °· Wear a good support bra for breast tenderness. °· Do not use hot tubs, steam rooms, or saunas. °· Wear your seat belt at all times when driving. °· Avoid raw meat, uncooked cheese, cat litter boxes, and soil used by cats. These carry germs that can cause birth defects in the baby. °· Take your prenatal vitamins. °· Try taking a stool softener (if your caregiver approves) if you develop constipation. Eat more high-fiber foods, such as fresh vegetables or fruit and whole grains. Drink plenty of fluids to keep your urine clear or pale yellow. °· Take warm sitz baths to soothe any pain or discomfort caused by hemorrhoids. Use hemorrhoid cream if your caregiver approves. °· If you develop varicose veins, wear support hose. Elevate your feet for 15 minutes, 3-4 times a day. Limit salt in your diet. °· Avoid heavy lifting, wear low heal shoes, and practice good posture. °· Rest a lot with your legs elevated if you have leg cramps or low back pain. °· Visit your dentist if you have not gone during your pregnancy. Use a soft toothbrush to brush your teeth and be gentle when you floss. °· A sexual relationship may be continued unless your caregiver directs you otherwise. °· Do not travel far distances unless it is absolutely necessary and only with the approval  of your caregiver. °· Take prenatal classes to understand, practice, and ask questions about the labor and delivery. °· Make a trial run to the hospital. °· Pack your hospital bag. °· Prepare the baby's nursery. °· Continue to go to all your prenatal visits as directed by your caregiver. °SEEK MEDICAL CARE IF: °· You are unsure if you are in labor or if your water has broken. °· You have dizziness. °· You have mild pelvic cramps, pelvic pressure, or nagging pain in your abdominal area. °· You have persistent nausea, vomiting, or diarrhea. °· You have a bad smelling vaginal discharge. °· You have pain with urination. °SEEK IMMEDIATE MEDICAL CARE IF:  °· You have a fever. °· You are leaking fluid from your vagina. °· You have spotting or bleeding from your vagina. °· You have severe abdominal cramping or pain. °· You have rapid weight loss or gain. °· You have shortness of breath with chest pain. °· You notice sudden or extreme swelling   of your face, hands, ankles, feet, or legs. °· You have not felt your baby move in over an hour. °· You have severe headaches that do not go away with medicine. °· You have vision changes. °Document Released: 06/13/2001 Document Revised: 06/24/2013 Document Reviewed: 08/20/2012 °[employer]® Patient Information ©2015 [employer]. This information is not intended to replace advice given to you by your health care provider. Make sure you discuss any questions you have with your health care provider. °Fetal Movement Counts °Patient Name: __________________________________________________ Patient Due Date: ____________________ °Performing a fetal movement count is highly recommended in high-risk pregnancies, but it is good for every pregnant woman to do. Your caregiver may ask you to start counting fetal movements at 28 weeks of the pregnancy. Fetal movements often increase: °· After eating a full meal. °· After physical activity. °· After eating or drinking something sweet or cold. °· At  rest. °Pay attention to when you feel the baby is most active. This will help you notice a pattern of your baby's sleep and wake cycles and what factors contribute to an increase in fetal movement. It is important to perform a fetal movement count at the same time each day when your baby is normally most active.  °HOW TO COUNT FETAL MOVEMENTS °1. Find a quiet and comfortable area to sit or lie down on your left side. Lying on your left side provides the best blood and oxygen circulation to your baby. °2. Write down the day and time on a sheet of paper or in a journal. °3. Start counting kicks, flutters, swishes, rolls, or jabs in a 2 hour period. You should feel at least 10 movements within 2 hours. °4. If you do not feel 10 movements in 2 hours, wait 2-3 hours and count again. Look for a change in the pattern or not enough counts in 2 hours. °SEEK MEDICAL CARE IF: °· You feel less than 10 counts in 2 hours, tried twice. °· There is no movement in over an hour. °· The pattern is changing or taking longer each day to reach 10 counts in 2 hours. °· You feel the baby is not moving as he or she usually does. °Date: ____________ Movements: ____________ Start time: ____________ Finish time: ____________  °Date: ____________ Movements: ____________ Start time: ____________ Finish time: ____________ °Date: ____________ Movements: ____________ Start time: ____________ Finish time: ____________ °Date: ____________ Movements: ____________ Start time: ____________ Finish time: ____________ °Date: ____________ Movements: ____________ Start time: ____________ Finish time: ____________ °Date: ____________ Movements: ____________ Start time: ____________ Finish time: ____________ °Date: ____________ Movements: ____________ Start time: ____________ Finish time: ____________ °Date: ____________ Movements: ____________ Start time: ____________ Finish time: ____________  °Date: ____________ Movements: ____________ Start time:  ____________ Finish time: ____________ °Date: ____________ Movements: ____________ Start time: ____________ Finish time: ____________ °Date: ____________ Movements: ____________ Start time: ____________ Finish time: ____________ °Date: ____________ Movements: ____________ Start time: ____________ Finish time: ____________ °Date: ____________ Movements: ____________ Start time: ____________ Finish time: ____________ °Date: ____________ Movements: ____________ Start time: ____________ Finish time: ____________ °Date: ____________ Movements: ____________ Start time: ____________ Finish time: ____________  °Date: ____________ Movements: ____________ Start time: ____________ Finish time: ____________ °Date: ____________ Movements: ____________ Start time: ____________ Finish time: ____________ °Date: ____________ Movements: ____________ Start time: ____________ Finish time: ____________ °Date: ____________ Movements: ____________ Start time: ____________ Finish time: ____________ °Date: ____________ Movements: ____________ Start time: ____________ Finish time: ____________ °Date: ____________ Movements: ____________ Start time: ____________ Finish time: ____________ °Date: ____________ Movements: ____________ Start time: ____________ Finish time: ____________  °  Date: ____________ Movements: ____________ Start time: ____________ Finish time: ____________ °Date: ____________ Movements: ____________ Start time: ____________ Finish time: ____________ °Date: ____________ Movements: ____________ Start time: ____________ Finish time: ____________ °Date: ____________ Movements: ____________ Start time: ____________ Finish time: ____________ °Date: ____________ Movements: ____________ Start time: ____________ Finish time: ____________ °Date: ____________ Movements: ____________ Start time: ____________ Finish time: ____________ °Date: ____________ Movements: ____________ Start time: ____________ Finish time: ____________  °Date:  ____________ Movements: ____________ Start time: ____________ Finish time: ____________ °Date: ____________ Movements: ____________ Start time: ____________ Finish time: ____________ °Date: ____________ Movements: ____________ Start time: ____________ Finish time: ____________ °Date: ____________ Movements: ____________ Start time: ____________ Finish time: ____________ °Date: ____________ Movements: ____________ Start time: ____________ Finish time: ____________ °Date: ____________ Movements: ____________ Start time: ____________ Finish time: ____________ °Date: ____________ Movements: ____________ Start time: ____________ Finish time: ____________  °Date: ____________ Movements: ____________ Start time: ____________ Finish time: ____________ °Date: ____________ Movements: ____________ Start time: ____________ Finish time: ____________ °Date: ____________ Movements: ____________ Start time: ____________ Finish time: ____________ °Date: ____________ Movements: ____________ Start time: ____________ Finish time: ____________ °Date: ____________ Movements: ____________ Start time: ____________ Finish time: ____________ °Date: ____________ Movements: ____________ Start time: ____________ Finish time: ____________ °Date: ____________ Movements: ____________ Start time: ____________ Finish time: ____________  °Date: ____________ Movements: ____________ Start time: ____________ Finish time: ____________ °Date: ____________ Movements: ____________ Start time: ____________ Finish time: ____________ °Date: ____________ Movements: ____________ Start time: ____________ Finish time: ____________ °Date: ____________ Movements: ____________ Start time: ____________ Finish time: ____________ °Date: ____________ Movements: ____________ Start time: ____________ Finish time: ____________ °Date: ____________ Movements: ____________ Start time: ____________ Finish time: ____________ °Date: ____________ Movements: ____________ Start  time: ____________ Finish time: ____________  °Date: ____________ Movements: ____________ Start time: ____________ Finish time: ____________ °Date: ____________ Movements: ____________ Start time: ____________ Finish time: ____________ °Date: ____________ Movements: ____________ Start time: ____________ Finish time: ____________ °Date: ____________ Movements: ____________ Start time: ____________ Finish time: ____________ °Date: ____________ Movements: ____________ Start time: ____________ Finish time: ____________ °Date: ____________ Movements: ____________ Start time: ____________ Finish time: ____________ °Document Released: 07/19/2006 Document Revised: 06/05/2012 Document Reviewed: 04/15/2012 °[employer]® Patient Information ©2015 [employer]. This information is not intended to replace advice given to you by your health care provider. Make sure you discuss any questions you have with your health care provider. ° °

## 2014-01-16 ENCOUNTER — Encounter (HOSPITAL_COMMUNITY): Payer: Self-pay | Admitting: *Deleted

## 2014-01-16 ENCOUNTER — Telehealth (HOSPITAL_COMMUNITY): Payer: Self-pay | Admitting: *Deleted

## 2014-01-16 NOTE — Telephone Encounter (Signed)
Preadmission screen  

## 2014-01-20 ENCOUNTER — Encounter (HOSPITAL_COMMUNITY): Payer: Self-pay | Admitting: *Deleted

## 2014-01-20 NOTE — Telephone Encounter (Signed)
Preadmission screen  

## 2014-01-21 ENCOUNTER — Inpatient Hospital Stay (HOSPITAL_COMMUNITY)
Admission: RE | Admit: 2014-01-21 | Discharge: 2014-01-23 | DRG: 775 | Disposition: A | Discharge status: Home / Self Care | Payer: BC Managed Care – PPO | Source: Ambulatory Visit | Attending: Obstetrics and Gynecology | Admitting: Obstetrics and Gynecology

## 2014-01-21 ENCOUNTER — Inpatient Hospital Stay (HOSPITAL_COMMUNITY): Payer: BC Managed Care – PPO | Admitting: Anesthesiology

## 2014-01-21 ENCOUNTER — Encounter (HOSPITAL_COMMUNITY): Payer: Self-pay

## 2014-01-21 ENCOUNTER — Encounter (HOSPITAL_COMMUNITY): Payer: BC Managed Care – PPO | Admitting: Anesthesiology

## 2014-01-21 VITALS — BP 127/87 | HR 56 | Temp 98.2°F | Resp 18 | Ht 65.0 in | Wt 209.0 lb

## 2014-01-21 DIAGNOSIS — F3289 Other specified depressive episodes: Secondary | ICD-10-CM | POA: Diagnosis present

## 2014-01-21 DIAGNOSIS — D689 Coagulation defect, unspecified: Secondary | ICD-10-CM | POA: Diagnosis present

## 2014-01-21 DIAGNOSIS — D6800 Von Willebrand disease, unspecified: Secondary | ICD-10-CM | POA: Diagnosis present

## 2014-01-21 DIAGNOSIS — Z3490 Encounter for supervision of normal pregnancy, unspecified, unspecified trimester: Secondary | ICD-10-CM

## 2014-01-21 DIAGNOSIS — Z87891 Personal history of nicotine dependence: Secondary | ICD-10-CM

## 2014-01-21 DIAGNOSIS — O9912 Other diseases of the blood and blood-forming organs and certain disorders involving the immune mechanism complicating childbirth: Secondary | ICD-10-CM

## 2014-01-21 DIAGNOSIS — Z3493 Encounter for supervision of normal pregnancy, unspecified, third trimester: Secondary | ICD-10-CM

## 2014-01-21 DIAGNOSIS — O480 Post-term pregnancy: Principal | ICD-10-CM | POA: Diagnosis present

## 2014-01-21 DIAGNOSIS — O99344 Other mental disorders complicating childbirth: Secondary | ICD-10-CM | POA: Diagnosis present

## 2014-01-21 DIAGNOSIS — F329 Major depressive disorder, single episode, unspecified: Secondary | ICD-10-CM | POA: Diagnosis present

## 2014-01-21 LAB — CBC
HEMATOCRIT: 33.7 % — AB (ref 36.0–46.0)
HEMOGLOBIN: 11.3 g/dL — AB (ref 12.0–15.0)
MCH: 27 pg (ref 26.0–34.0)
MCHC: 33.5 g/dL (ref 30.0–36.0)
MCV: 80.4 fL (ref 78.0–100.0)
Platelets: 168 10*3/uL (ref 150–400)
RBC: 4.19 MIL/uL (ref 3.87–5.11)
RDW: 14.7 % (ref 11.5–15.5)
WBC: 6 10*3/uL (ref 4.0–10.5)

## 2014-01-21 LAB — RPR

## 2014-01-21 MED ORDER — DIPHENHYDRAMINE HCL 50 MG/ML IJ SOLN: 12.5000 mg | INTRAMUSCULAR | Status: DC | PRN

## 2014-01-21 MED ORDER — TETANUS-DIPHTH-ACELL PERTUSSIS 5-2.5-18.5 LF-MCG/0.5 IM SUSP: 0.5000 mL | Freq: Once | INTRAMUSCULAR | Status: DC

## 2014-01-21 MED ORDER — OXYCODONE-ACETAMINOPHEN 5-325 MG PO TABS
1.0000 | ORAL_TABLET | ORAL | Status: DC | PRN
  Administered 2014-01-21: 2 via ORAL
  Administered 2014-01-21: 1 via ORAL
  Administered 2014-01-22 – 2014-01-23 (×7): 2 via ORAL
  Filled 2014-01-21 (×7): qty 2
  Filled 2014-01-21: qty 1
  Filled 2014-01-21: qty 2

## 2014-01-21 MED ORDER — LANOLIN HYDROUS EX OINT: TOPICAL_OINTMENT | CUTANEOUS | Status: DC | PRN

## 2014-01-21 MED ORDER — EPHEDRINE 5 MG/ML INJ
10.0000 mg | INTRAVENOUS | Status: DC | PRN
  Filled 2014-01-21: qty 2

## 2014-01-21 MED ORDER — FENTANYL 2.5 MCG/ML BUPIVACAINE 1/10 % EPIDURAL INFUSION (WH - ANES): 14.0000 mL/h | INTRAMUSCULAR | Status: DC | PRN

## 2014-01-21 MED ORDER — CITRIC ACID-SODIUM CITRATE 334-500 MG/5ML PO SOLN: 30.0000 mL | ORAL | Status: DC | PRN

## 2014-01-21 MED ORDER — ACETAMINOPHEN 325 MG PO TABS: 650.0000 mg | ORAL_TABLET | ORAL | Status: DC | PRN

## 2014-01-21 MED ORDER — PHENYLEPHRINE 40 MCG/ML (10ML) SYRINGE FOR IV PUSH (FOR BLOOD PRESSURE SUPPORT)
80.0000 ug | PREFILLED_SYRINGE | INTRAVENOUS | Status: DC | PRN
  Filled 2014-01-21: qty 2

## 2014-01-21 MED ORDER — LIDOCAINE HCL (PF) 1 % IJ SOLN
30.0000 mL | INTRAMUSCULAR | Status: DC | PRN
  Filled 2014-01-21: qty 30

## 2014-01-21 MED ORDER — LACTATED RINGERS IV SOLN: 500.0000 mL | INTRAVENOUS | Status: DC | PRN

## 2014-01-21 MED ORDER — ONDANSETRON HCL 4 MG/2ML IJ SOLN: 4.0000 mg | Freq: Four times a day (QID) | INTRAMUSCULAR | Status: DC | PRN

## 2014-01-21 MED ORDER — IBUPROFEN 600 MG PO TABS: 600.0000 mg | ORAL_TABLET | Freq: Four times a day (QID) | ORAL | Status: DC | PRN

## 2014-01-21 MED ORDER — PRENATAL MULTIVITAMIN CH
1.0000 | ORAL_TABLET | Freq: Every day | ORAL | Status: DC
  Administered 2014-01-22 – 2014-01-23 (×2): 1 via ORAL
  Filled 2014-01-21 (×2): qty 1

## 2014-01-21 MED ORDER — ONDANSETRON HCL 4 MG/2ML IJ SOLN: 4.0000 mg | INTRAMUSCULAR | Status: DC | PRN

## 2014-01-21 MED ORDER — DIBUCAINE 1 % RE OINT
1.0000 "application " | TOPICAL_OINTMENT | RECTAL | Status: DC | PRN
  Administered 2014-01-22 (×2): 1 via RECTAL
  Filled 2014-01-21 (×2): qty 28

## 2014-01-21 MED ORDER — PHENYLEPHRINE 40 MCG/ML (10ML) SYRINGE FOR IV PUSH (FOR BLOOD PRESSURE SUPPORT)
PREFILLED_SYRINGE | INTRAVENOUS | Status: AC
  Filled 2014-01-21: qty 10

## 2014-01-21 MED ORDER — BUTORPHANOL TARTRATE 1 MG/ML IJ SOLN: 1.0000 mg | INTRAMUSCULAR | Status: DC | PRN

## 2014-01-21 MED ORDER — FENTANYL 2.5 MCG/ML BUPIVACAINE 1/10 % EPIDURAL INFUSION (WH - ANES)
14.0000 mL/h | INTRAMUSCULAR | Status: DC | PRN
  Administered 2014-01-21: 14 mL/h via EPIDURAL

## 2014-01-21 MED ORDER — LACTATED RINGERS IV SOLN: INTRAVENOUS | Status: DC

## 2014-01-21 MED ORDER — DIPHENHYDRAMINE HCL 25 MG PO CAPS: 25.0000 mg | ORAL_CAPSULE | Freq: Four times a day (QID) | ORAL | Status: DC | PRN

## 2014-01-21 MED ORDER — ONDANSETRON HCL 4 MG PO TABS: 4.0000 mg | ORAL_TABLET | ORAL | Status: DC | PRN

## 2014-01-21 MED ORDER — WITCH HAZEL-GLYCERIN EX PADS
1.0000 "application " | MEDICATED_PAD | CUTANEOUS | Status: DC | PRN
  Administered 2014-01-23: 1 via TOPICAL

## 2014-01-21 MED ORDER — OXYTOCIN 40 UNITS IN LACTATED RINGERS INFUSION - SIMPLE MED
1.0000 m[IU]/min | INTRAVENOUS | Status: DC
Start: 2014-01-21 — End: 2014-01-21
  Administered 2014-01-21: 4 m[IU]/min via INTRAVENOUS
  Administered 2014-01-21: 2 m[IU]/min via INTRAVENOUS
  Administered 2014-01-21: 6 m[IU]/min via INTRAVENOUS
  Filled 2014-01-21: qty 1000

## 2014-01-21 MED ORDER — LACTATED RINGERS IV SOLN: 500.0000 mL | Freq: Once | INTRAVENOUS | Status: DC

## 2014-01-21 MED ORDER — IBUPROFEN 600 MG PO TABS
600.0000 mg | ORAL_TABLET | Freq: Four times a day (QID) | ORAL | Status: DC
  Administered 2014-01-21 – 2014-01-23 (×8): 600 mg via ORAL
  Filled 2014-01-21 (×8): qty 1

## 2014-01-21 MED ORDER — SENNOSIDES-DOCUSATE SODIUM 8.6-50 MG PO TABS
2.0000 | ORAL_TABLET | ORAL | Status: DC
  Administered 2014-01-21 – 2014-01-22 (×2): 2 via ORAL
  Filled 2014-01-21 (×2): qty 2

## 2014-01-21 MED ORDER — BENZOCAINE-MENTHOL 20-0.5 % EX AERO
1.0000 "application " | INHALATION_SPRAY | CUTANEOUS | Status: DC | PRN
  Administered 2014-01-21 – 2014-01-23 (×2): 1 via TOPICAL
  Filled 2014-01-21 (×2): qty 56

## 2014-01-21 MED ORDER — SIMETHICONE 80 MG PO CHEW
80.0000 mg | CHEWABLE_TABLET | ORAL | Status: DC | PRN
  Administered 2014-01-22 – 2014-01-23 (×3): 80 mg via ORAL
  Filled 2014-01-21 (×3): qty 1

## 2014-01-21 MED ORDER — EPHEDRINE 5 MG/ML INJ
INTRAVENOUS | Status: AC
  Filled 2014-01-21: qty 4

## 2014-01-21 MED ORDER — OXYTOCIN 40 UNITS IN LACTATED RINGERS INFUSION - SIMPLE MED: 62.5000 mL/h | INTRAVENOUS | Status: DC

## 2014-01-21 MED ORDER — ZOLPIDEM TARTRATE 5 MG PO TABS: 5.0000 mg | ORAL_TABLET | Freq: Every evening | ORAL | Status: DC | PRN

## 2014-01-21 MED ORDER — FENTANYL 2.5 MCG/ML BUPIVACAINE 1/10 % EPIDURAL INFUSION (WH - ANES)
INTRAMUSCULAR | Status: AC
  Administered 2014-01-21: 16:00:00
  Filled 2014-01-21: qty 125

## 2014-01-21 MED ORDER — OXYCODONE-ACETAMINOPHEN 5-325 MG PO TABS: 1.0000 | ORAL_TABLET | ORAL | Status: DC | PRN

## 2014-01-21 MED ORDER — OXYTOCIN BOLUS FROM INFUSION: 500.0000 mL | INTRAVENOUS | Status: DC

## 2014-01-21 MED ORDER — TERBUTALINE SULFATE 1 MG/ML IJ SOLN: 0.2500 mg | Freq: Once | INTRAMUSCULAR | Status: DC | PRN

## 2014-01-21 NOTE — Progress Notes (Signed)
   Subjective: Pt doing well. Comfortable with epidural  Objective: BP 119/70  Pulse 67  Temp(Src) 98.5 F (36.9 C) (Oral)  Resp 18  Ht 5\' 5"  (1.651 m)  Wt 94.802 kg (209 lb)  BMI 34.78 kg/m2  SpO2 99%  LMP 04/07/2013      FHT:  FHR: 145 bpm, variability: moderate,  accelerations:  Present,  decelerations:  Absent UC:   regular, every 2-4 minutes SVE:   Dilation: 4 Effacement (%): 80 Station: -1 Exam by:: Dr Senaida Oresrichardson  Labs: Lab Results  Component Value Date   WBC 6.0 01/21/2014   HGB 11.3* 01/21/2014   HCT 33.7* 01/21/2014   MCV 80.4 01/21/2014   PLT 168 01/21/2014    Assessment / Plan: Labor progressing, doing well. Vanessa [name],Vanessa [name] W 01/21/2014, 1:56 PM

## 2014-01-21 NOTE — H&P (Signed)
Vanessa [name] is a 28 y.o. female [id number] at 2541 2/7 weeks (EDD 01/12/14 by LMP c/w 7 week US) presenting for IOL postdates.  Prenatal care complicated by a history of a prior baby with hypoplastic left heart syndrome that died at 3 days of life.  With this pregnancy she had a fetal ECHO x 2 at 18 and 22 weeks that was WNL.  She also has von Willebrand's Type one and saw Dr. Myna HidalgoEnnever of hematology throughout the pregnancy.  She had normal blood levels on 12/22/13 and his feeling was that she would do fine with vaginal delivery and regional anesthesia with no interventions.  If she was to need a c-section, he did not feel she would need ddAVP preventively, but I have confirmed with pharmacy that this is available in the event of any uexpected issues.  Maternal Medical History:  Contractions: Frequency: irregular.   Perceived severity is mild.    Fetal activity: Perceived fetal activity is normal.    Prenatal Complications - Diabetes: none.    OB History   Grav Para Term Preterm Abortions TAB SAB Ect Mult Living   5 2 2  0 2 0 2 0 0 1    NSVD 2007 7#14oz NSVD  2011 7#13oz SAB x2  Past Medical History  Diagnosis Date  . Von Willebrand disease   . Depression    Past Surgical History  Procedure Laterality Date  . Tonsillectomy      and adenoid   Family History: family history includes Birth defects in her sister and son. Social History:  reports that she quit smoking about 17 months ago. Her smoking use included Cigarettes. She started smoking about 16 years ago. She has a 3.75 pack-year smoking history. She has never used smokeless tobacco. Her alcohol and drug histories are not on file.   Prenatal Transfer Tool  Maternal Diabetes: No Genetic Screening: Normal Maternal Ultrasounds/Referrals: Normal Fetal Ultrasounds or other Referrals:  Fetal echo--WNL Maternal Substance Abuse:  No Significant Maternal Medications:  None Significant Maternal Lab Results:  None Other Comments:   None  Review of Systems  Gastrointestinal: Negative for abdominal pain.  Neurological: Negative for headaches.      Last menstrual period 04/07/2013. Maternal Exam:  Uterine Assessment: Contraction strength is mild.  Contraction frequency is irregular.   Abdomen: Patient reports no abdominal tenderness. Fetal presentation: vertex  Introitus: Normal vulva. Normal vagina.  Pelvis: adequate for delivery.      Physical Exam  Constitutional: She appears well-developed and well-nourished.  Cardiovascular: Normal rate and regular rhythm.   Respiratory: Effort normal.  GI: Soft.  Genitourinary: Vagina normal and uterus normal.  Psychiatric: She has a normal mood and affect.    Prenatal labs: ABO, Rh: A/Positive/-- (12/18 0000) Antibody: Negative (12/18 0000) Rubella: Nonimmune (12/18 0000) RPR: Nonreactive (12/18 0000)  HBsAg: Negative (12/18 0000)  HIV: Non-reactive (12/18 0000)  GBS: Negative (06/12 0000)  Declined genetic screens One hour GTT 130 CF negative  Assessment/Plan: Pt with two prior successful vaginal deliveries with no intervention needed for her von WIllebrand's disease.  She is admitted for IOL as now 41 2/7 weeks.  Plan AROM and pitocin.   Oliver PilaRICHARDSON,Kastiel Simonian W 01/21/2014, 6:51 AM

## 2014-01-21 NOTE — Progress Notes (Signed)
Patient ID: Vanessa PeacockLisa C [name], female   DOB: 1985-12-24, 28 y.o.   MRN: [id number] Pt on pitocin and feeling mild contractions FHR category 1 Cervix 50/2-3/-2 AROM with FSE of copious clear fluid Pitocin at 2 mu, will increase and follow progress.

## 2014-01-21 NOTE — Progress Notes (Signed)
Pt request enema

## 2014-01-21 NOTE — Anesthesia Preprocedure Evaluation (Signed)
Anesthesia Evaluation  Patient identified by MRN, date of birth, ID band Patient awake    Reviewed: Allergy & Precautions, H&P , Patient's Chart, lab work & pertinent test results  Airway Mallampati: II TM Distance: >3 FB Neck ROM: full    Dental  (+) Teeth Intact   Pulmonary former smoker,  breath sounds clear to auscultation        Cardiovascular Rhythm:regular Rate:Normal     Neuro/Psych    GI/Hepatic   Endo/Other    Renal/GU      Musculoskeletal   Abdominal   Peds  Hematology Von Willebrand Dz; minimal.  Note in HX says she has normal activity   Anesthesia Other Findings       Reproductive/Obstetrics (+) Pregnancy                           Anesthesia Physical Anesthesia Plan  ASA: II  Anesthesia Plan: Epidural   Post-op Pain Management:    Induction:   Airway Management Planned:   Additional Equipment:   Intra-op Plan:   Post-operative Plan:   Informed Consent: I have reviewed the patients History and Physical, chart, labs and discussed the procedure including the risks, benefits and alternatives for the proposed anesthesia with the patient or authorized representative who has indicated his/her understanding and acceptance.   Dental Advisory Given  Plan Discussed with:   Anesthesia Plan Comments: (Labs checked- platelets confirmed with RN in room. Fetal heart tracing, per RN, reported to be stable enough for sitting procedure. Discussed epidural, and patient consents to the procedure:  included risk of possible headache,backache, failed block, allergic reaction, and nerve injury. This patient was asked if she had any questions or concerns before the procedure started.)        Anesthesia Quick Evaluation

## 2014-01-22 LAB — CBC
HEMATOCRIT: 30.4 % — AB (ref 36.0–46.0)
Hemoglobin: 10.2 g/dL — ABNORMAL LOW (ref 12.0–15.0)
MCH: 27.3 pg (ref 26.0–34.0)
MCHC: 33.6 g/dL (ref 30.0–36.0)
MCV: 81.3 fL (ref 78.0–100.0)
Platelets: 149 10*3/uL — ABNORMAL LOW (ref 150–400)
RBC: 3.74 MIL/uL — ABNORMAL LOW (ref 3.87–5.11)
RDW: 14.7 % (ref 11.5–15.5)
WBC: 7.8 10*3/uL (ref 4.0–10.5)

## 2014-01-22 MED ORDER — MEASLES, MUMPS & RUBELLA VAC ~~LOC~~ INJ
0.5000 mL | INJECTION | Freq: Once | SUBCUTANEOUS | Status: AC
  Administered 2014-01-23: 0.5 mL via SUBCUTANEOUS
  Filled 2014-01-22: qty 0.5

## 2014-01-22 NOTE — Discharge Summary (Addendum)
Obstetric Discharge Summary Reason for Admission: induction of labor Prenatal Procedures: none Intrapartum Procedures: spontaneous vaginal delivery Postpartum Procedures: Rubella Ig Complications-Operative and Postpartum: first degree perineal laceration HGB  Date Value Ref Range Status  12/22/2013 11.0* 11.6 - 15.9 g/dL Final  1/61/09601/31/2011 45.411.5* 11.6 - 15.9 g/dL Final     Hemoglobin  Date Value Ref Range Status  01/22/2014 10.2* 12.0 - 15.0 g/dL Final     HCT  Date Value Ref Range Status  01/22/2014 30.4* 36.0 - 46.0 % Final  12/22/2013 32.5* 34.8 - 46.6 % Final  08/02/2009 34.3* 34.8 - 46.6 % Final    Physical Exam:  General: alert and cooperative Lochia: appropriate Uterine Fundus: firm   Discharge Diagnoses: Term Pregnancy-delivered                                         Type 1 Von willebrand's disease  Discharge Information: Date: 01/22/2014 Activity: pelvic rest Diet: routine Medications: Ibuprofen and Percocet Condition: improved Instructions: refer to practice specific booklet Discharge to: home Follow-up Information   Follow up with Oliver PilaICHARDSON,Camarion Weier W, MD In 6 weeks. (postpartum)    Specialty:  Obstetrics and Gynecology   Contact information:   [address] [place] [address] [phone]       Newborn Data: Live born female  Birth Weight: 8 lb 6 oz (3799 g) APGAR:  8 ,9  Home with mother.  Oliver PilaRICHARDSON,Tiffanie Blassingame W 01/22/2014, 9:19 AM

## 2014-01-22 NOTE — Lactation Note (Signed)
This note was copied from the chart of Vanessa Tobey BrideLisa Chenoweth. Lactation Consultation Note Attempted to visit mom several times, was sleeping 2 times, and then was in shower this last time. Dad in rm. Stated BF was going great, mom BF 4-5 months the 1st child. Explained LC would come back and round on her later in day. Left information brochure LC. Patient Name: Vanessa [name] FAOZH'YToday's Date: 01/22/2014     Maternal Data    Feeding    LATCH Score/Interventions                      Lactation Tools Discussed/Used     Consult Status      Vanessa DancerCARVER, Vanessa [name] 01/22/2014, 6:33 AM

## 2014-01-22 NOTE — Anesthesia Postprocedure Evaluation (Signed)
  Anesthesia Post-op Note  Patient: Vanessa [name]  Procedure(s) Performed: * No procedures listed *  Patient Location: Mother/Baby  Anesthesia Type:Epidural  Level of Consciousness: awake, alert , oriented and patient cooperative  Airway and Oxygen Therapy: Patient Spontanous Breathing  Post-op Pain: mild  Post-op Assessment: Post-op Vital signs reviewed, Patient's Cardiovascular Status Stable, Respiratory Function Stable, Patent Airway, No signs of Nausea or vomiting, Adequate PO intake, Pain level controlled, No headache, No backache, No residual numbness and No residual motor weakness  Post-op Vital Signs: Reviewed and stable  Last Vitals:  Filed Vitals:   01/22/14 0600  BP: 97/59  Pulse: 72  Temp: 36.9 C  Resp: 18    Complications: No apparent anesthesia complications

## 2014-01-22 NOTE — Progress Notes (Signed)
Post Partum Day 1 Subjective: no complaints and tolerating PO  Objective: Blood pressure 97/59, pulse 72, temperature 98.5 F (36.9 C), temperature source Oral, resp. rate 18, height 5\' 5"  (1.651 m), weight 94.802 kg (209 lb), last menstrual period 04/07/2013, SpO2 98.00%, unknown if currently breastfeeding.  Physical Exam:  General: alert and cooperative Lochia: appropriate Uterine Fundus: firm    Recent Labs  01/21/14 0730 01/22/14 0705  HGB 11.3* 10.2*  HCT 33.7* 30.4*    Assessment/Plan: Plan for discharge tomorrow   LOS: 1 day   Kellan Boehlke W 01/22/2014, 9:18 AM

## 2014-01-23 MED ORDER — IBUPROFEN 600 MG PO TABS: 600.0000 mg | ORAL_TABLET | Freq: Four times a day (QID) | ORAL | Status: DC

## 2014-01-23 MED ORDER — OXYCODONE-ACETAMINOPHEN 5-325 MG PO TABS: 1.0000 | ORAL_TABLET | ORAL | Status: DC | PRN

## 2014-01-23 NOTE — Lactation Note (Signed)
This note was copied from the chart of Vanessa [name]. Lactation Consultation Note: Follow up visit before DC. Mom has baby latched to breast when I went into room. Mom reports that RN assisted her with latch and this time it feels better. Reviewed wide open mouth and having the baby deep onto the breast. Both nipples pink but intact. Has comfort gels on. Assisted with latch to other breast in football hold. Mom unable to hand express Colostrum but reports breat feels a little fuller this morning. Has history of low milk supply with her first. Has DEBP at home. Suggested pumping 4-6 times/day after nursing to promote milk supply.Offered OP appointment but mom wants to go home and see how things go. No questions at present. To call prn  Patient Name: Vanessa Tobey BrideLisa Mendolia UJWJX'BToday's Date: 01/23/2014 Reason for consult: Follow-up assessment   Maternal Data Formula Feeding for Exclusion: No  Feeding Feeding Type: Breast Fed Length of feed: 35 min  LATCH Score/Interventions Latch: Grasps breast easily, tongue down, lips flanged, rhythmical sucking. Intervention(s): Adjust position;Assist with latch (baby not on deep enough)  Audible Swallowing: A few with stimulation  Type of Nipple: Everted at rest and after stimulation  Comfort (Breast/Nipple): Filling, red/small blisters or bruises, mild/mod discomfort  Problem noted: Mild/Moderate discomfort Interventions (Mild/moderate discomfort): Comfort gels  Hold (Positioning): Assistance needed to correctly position infant at breast and maintain latch. Intervention(s): Breastfeeding basics reviewed;Support Pillows;Position options  LATCH Score: 7  Lactation Tools Discussed/Used Tools: Comfort gels WIC Program: No Pump Review: Setup, frequency, and cleaning Initiated by:: DW Date initiated:: 01/23/14   Consult Status Consult Status: Complete    Pamelia HoitWeeks, Gordie Crumby D 01/23/2014, 9:00 AM

## 2014-01-23 NOTE — Progress Notes (Signed)
Clinical Social Work Department PSYCHOSOCIAL ASSESSMENT - MATERNAL/CHILD 2013-09-27  Patient:  Vanessa [name], Vanessa [name]  Account Number:  [id number]  Admit Date:  10/21/2013  Ardine Eng Name:   Lynelle Doctor    Clinical Social Worker:  Lucita Ferrara, CLINICAL SOCIAL WORKER   Date/Time:  09-14-13 09:00 AM  Date Referred:  2013-09-22   Referral source  Physician     Referred reason  Depression/Anxiety   Other referral source:    I:  FAMILY / HOME ENVIRONMENT Child's legal guardian:  PARENT  Guardian - Name Guardian - Age [place], [place] [address]  Erskine Speed     Other household support members/support persons Name Relationship DOB  Ellin Fitzgibbons SON 2009-09-18   Other support:   Per MOB, maternal family lives in [place].  Per FOB, paternal family lives in [place].  MOB shared that MGM will be staying with the family for first couple of weeks to provide support and assistance.    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  FOB is employed at a [employer].   Financial resources:  Multimedia programmer If [place]:    School / Grade:   Maternity Care Coordinator / Child Services Coordination / Early Interventions:    Cultural issues impacting care:   None reported    III  STRENGTHS Strengths  Adequate Resources  Compliance with medical plan  Home prepared for Child (including basic supplies)  Supportive family/friends   Strength comment:    IV  RISK FACTORS AND CURRENT PROBLEMS Current Problem:  YES   Risk Factor & Current Problem Patient Issue Family Issue Risk Factor / Current Problem Comment  Mental Illness Y N History of anxiety following death of son in 09-18-05.  MOB shared that following birth of her son in 2009-09-18, she was anxious and hyper-vigilant. MOB has history of being  prescribed Wellbutin and Zoloft, but has not been prescribed medications in past year.  MOB denied current symptoms and denied symptoms during her pregnancy.    V  SOCIAL WORK ASSESSMENT CSW met with MOB in her first floor room, room 122, to complete assessment due to reports of intermittently increasing anxiety related to previously loss of newborn 3 days after birth.  MOB provided consent for FOB to be present throughout the assessment.  MOB and FOB were receptive to the assessment, easily engaged, answered all questions and were willing to provide details as necessary.  FOB was observed to be holding [place] throughout the assessment, maintained consistent eye contact with Jodi Mourning, demonstrating intentions to bond with Jodi Mourning.  MOB displayed a full range in affect, had a positive attitude, and reported anxiety appropriate for recent birth of a baby.  MOB denied any current symptoms of anxiety and depression, expressed happiness and gratitude to "finally" expand their family.   MOB and FOB expressed readiness for discharge and to return to their home in [place].  MOB stated that they have had numerous visitors from supportive family and friends while they have been at the hospital, but shared that she is eager to return home to have time with just FOB, Vanessa [name], and her 30 year-old son, Vanessa [name].  MOB denied concerns about returning home "this time".  Upon further exploration, MOB reviewed death of her son in 2005-09-18 due to a heart condition that was undiagnosed when he  was in the hospital following his birth.  MOB shared that she has little memory of the year due to the traumatic loss since the pregnancy and birth of son was "perfect", and it was only after 3 days at home, that they noted that "something was wrong" with her son, he was brought back to the hospital, and died.  MOB acknowledges that she depressed following this loss, and shared that her MD prescribed her Zoloft, but switched to Wellbutrin due to side  effects from Zoloft.  She shared that she was prescribed the medications for about a year.  MOB described that following the birth of Colp in 2007, she was very anxious when he returned home out of fear that he may die.  She admits that she did not sleep during this time.  CSW highlighted MOB's personal values of being a mother and caring for others, and explored with MOB how lack of sleep and lack of self-care may hinder feelings expression.  MOB willingly explored how it impacted her mood and overall quality of life at school since it was difficult for her to focus and concentrate.  CSW explored current symptoms of depression and anxiety, but MOB denied any symptoms.  MOB shared belief that she currently does not feel anxious about discharge or returning home.  CSW provided education on PPD to MOB and FOB, and normalized some anxiety that may result from having a newborn.  CSW emphasized the importance of self-care, and reviewed how self-care will allow MOB to express her personal values of being family-oriented.  MOB receptive to feedback and support, and shared belief that her positive support system and being more stable (in comparison to 2007) will assist her with this transition to having a newborn in the home.   MOB aware of resources in the area in the even she needs further mental health treatment, denied need for referral at this time.   MOB agreeable to consulting with her MD or Timia's pediatrician (Dr. Denyse Amass) if symptoms of PPD and anxiety become a notable concerns.   CSW encouraged MOB and FOB to reach out to CSW prior to discharge if questions or concerns arise, family willing.   No barriers to discharge.     VI SOCIAL WORK PLAN Social Work Plan  No Further Intervention Required / No Barriers to Discharge  Patient/Family Education   Type of pt/family education:   PPD and anxiety   If child protective services report - county:   If child protective services report - date:    Information/referral to community resources comment:   Other social work plan:

## 2014-01-23 NOTE — Progress Notes (Signed)
Post Partum Day 1 Subjective: no complaints, up ad lib and tolerating PO  Objective: Blood pressure 127/87, pulse 56, temperature 98.2 F (36.8 C), temperature source Oral, resp. rate 18, height 5\' 5"  (1.651 m), weight 94.802 kg (209 lb), last menstrual period 04/07/2013, SpO2 98.00%, unknown if currently breastfeeding.  Physical Exam:  General: alert and cooperative Lochia: appropriate Uterine Fundus: firm    Recent Labs  01/21/14 0730 01/22/14 0705  HGB 11.3* 10.2*  HCT 33.7* 30.4*    Assessment/Plan: Discharge home   LOS: 2 days   Lancelot Alyea W 01/23/2014, 8:06 AM

## 2014-01-28 ENCOUNTER — Other Ambulatory Visit (HOSPITAL_BASED_OUTPATIENT_CLINIC_OR_DEPARTMENT_OTHER): Payer: BC Managed Care – PPO | Admitting: Lab

## 2014-01-28 ENCOUNTER — Ambulatory Visit (HOSPITAL_BASED_OUTPATIENT_CLINIC_OR_DEPARTMENT_OTHER): Payer: BC Managed Care – PPO | Admitting: Hematology & Oncology

## 2014-01-28 VITALS — BP 131/90 | HR 80 | Temp 97.8°F | Resp 16 | Wt 188.0 lb

## 2014-01-28 DIAGNOSIS — D6801 Von willebrand disease, type 1: Secondary | ICD-10-CM

## 2014-01-28 DIAGNOSIS — D6800 Von Willebrand disease, unspecified: Secondary | ICD-10-CM

## 2014-01-28 LAB — CBC WITH DIFFERENTIAL (CANCER CENTER ONLY)
BASO#: 0 10*3/uL (ref 0.0–0.2)
BASO%: 0.2 % (ref 0.0–2.0)
EOS ABS: 0.1 10*3/uL (ref 0.0–0.5)
EOS%: 1 % (ref 0.0–7.0)
HCT: 35.8 % (ref 34.8–46.6)
HGB: 12.2 g/dL (ref 11.6–15.9)
LYMPH#: 1.2 10*3/uL (ref 0.9–3.3)
LYMPH%: 19.8 % (ref 14.0–48.0)
MCH: 27.6 pg (ref 26.0–34.0)
MCHC: 34.1 g/dL (ref 32.0–36.0)
MCV: 81 fL (ref 81–101)
MONO#: 0.5 10*3/uL (ref 0.1–0.9)
MONO%: 8.1 % (ref 0.0–13.0)
NEUT%: 70.9 % (ref 39.6–80.0)
NEUTROS ABS: 4.4 10*3/uL (ref 1.5–6.5)
Platelets: 294 10*3/uL (ref 145–400)
RBC: 4.42 10*6/uL (ref 3.70–5.32)
RDW: 14.1 % (ref 11.1–15.7)
WBC: 6.2 10*3/uL (ref 3.9–10.0)

## 2014-01-28 NOTE — Progress Notes (Signed)
Hematology and Oncology Follow Up Visit  Vanessa [name] [id number] [birth year] 28 y.o. 01/28/2014   Principle Diagnosis:  Type 1 von Willebrand  Postpartum pregnancy  Therapy:     observation      Interim History:  Ms.  [name] is  back for followup. She had her baby a week ago. There is a wet well. There's been no problems with bleeding. She had 5 hours of labor.  She got her pregnancy without any difficulties.  We last saw her, she had von Willebrand level of 196. Her factor VIII level was 278. Her Ristocetin Co-factor was 137%.  Since the delivery, and she has had some bleeding. Again I suspect her von Willebrand levels are down. I don't think we have to give her any type of factor product right now.  She is having some difficulties with breast-feeding. The baby has lost a pound since birth.  She otherwise has been doing okay. She's had no cough. She's had no nausea vomiting.  Medications: Current outpatient prescriptions:docusate sodium (COLACE) 100 MG capsule, Take 100 mg by mouth 3 (three) times daily., Disp: , Rfl: ;  oxyCODONE-acetaminophen (PERCOCET/ROXICET) 5-325 MG per tablet, Take 1-2 tablets by mouth every 4 (four) hours as needed for severe pain (moderate - severe pain)., Disp: 30 tablet, Rfl: 0;  Prenatal Vit-Fe Fumarate-FA (PRENATAL MULTIVITAMIN) TABS tablet, Take 1 tablet by mouth daily at 12 noon., Disp: , Rfl:  ibuprofen (ADVIL,MOTRIN) 600 MG tablet, Take 1 tablet (600 mg total) by mouth every 6 (six) hours., Disp: 30 tablet, Rfl: 0  Allergies: No Known Allergies  Past Medical History, Surgical history, Social history, and Family History were reviewed and updated.  Review of Systems:  as above   Physical Exam:  weight is 188 lb (85.276 kg). Her oral temperature is 97.8 F (36.6 C). Her blood pressure is 131/90 and her pulse is 80. Her respiration is 16.    well-developed and well-nourished white female. Head and neck exam shows no ocular or oral lesions. There  are no palpable cervical or supraclavicular lymph nodes. Lungs are clear bilateral. Cardiac exam regular in rhythm with no murmurs rubs or bruits. Abdomen is soft. She has good bowel sounds. There is no fluid wave. There is no palpable liver or spleen tip. Extremities shows no clubbing cyanosis or edema. Skin exam shows no rashes, ecchymoses or petechia. Neurological exam is nonfocal.  Lab Results  Component Value Date   WBC 6.2 01/28/2014   HGB 12.2 01/28/2014   HCT 35.8 01/28/2014   MCV 81 01/28/2014   PLT 294 01/28/2014     Chemistry      Component Value Date/Time   NA 139 02/17/2010 1639   K 4.1 02/17/2010 1639   CL 107 02/17/2010 1639   CO2 21 02/17/2010 1639   BUN 12 02/17/2010 1639   CREATININE 0.5 02/17/2010 1639      Component Value Date/Time   CALCIUM 9.1 02/17/2010 1639   ALKPHOS 67 02/17/2010 1639   AST 15 02/17/2010 1639   ALT 10 02/17/2010 1639   BILITOT 0.6 02/17/2010 1639         Impression and Plan: Vanessa [name] is  28 year old female with von Willebrand's disease. She had her baby a week ago. She is doing okay. However, I think that she is going to need to be monitored for a few months. She is at risk for bleeding with followup levels dropping.  I think we probably need to have her stay out of work  until the holidays are over. I believe that she needs to be at rest and just be with her baby. I think the working could potentially cause some exacerbation of her von Willebrand's and put her at risk for bleeding.  I would like to see her back in another 2 or 3 months. I want to make sure we continue to follow her von Willebrand levels.    Josph Macho, MD 7/29/20153:10 PM

## 2014-02-02 LAB — APTT: APTT: 36 s (ref 24–37)

## 2014-02-02 LAB — VON WILLEBRAND PANEL
COAGULATION FACTOR VIII: 233 % — AB (ref 73–140)
Ristocetin Co-factor, Plasma: 98 % (ref 42–200)
VON WILLEBRAND ANTIGEN, PLASMA: 128 % (ref 50–217)

## 2014-02-19 ENCOUNTER — Encounter: Payer: Self-pay | Admitting: *Deleted

## 2014-02-19 MED ORDER — FENTANYL 2.5 MCG/ML BUPIVACAINE 1/10 % EPIDURAL INFUSION (WH - ANES)
INTRAMUSCULAR | Status: DC | PRN
  Administered 2014-01-21: 14 mL/h via EPIDURAL

## 2014-02-19 NOTE — Anesthesia Procedure Notes (Signed)
Epidural Patient location during procedure: OB Start time: 01/21/2014 12:30 PM  Preanesthetic Checklist Completed: patient identified, site marked, surgical consent, pre-op evaluation, timeout performed, IV checked, risks and benefits discussed and monitors and equipment checked  Epidural Patient position: sitting Prep: site prepped and draped and DuraPrep Patient monitoring: continuous pulse ox and blood pressure Approach: midline Injection technique: LOR air  Needle:  Needle type: Tuohy  Needle gauge: 17 G Needle length: 9 cm and 9 Needle insertion depth: 5 cm cm Catheter type: closed end flexible Catheter size: 19 Gauge Catheter at skin depth: 10 cm Test dose: negative  Assessment Events: blood not aspirated, injection not painful, no injection resistance, negative IV test and no paresthesia  Additional Notes Dosing of Epidural:  1st dose, through catheter ............................................Marland Kitchen.  Xylocaine 40 mg  2nd dose, through catheter, after waiting 3 minutes........Marland Kitchen.Xylocaine 60 mg    ( 1% Xylo charted as a single dose in Epic Meds for ease of charting; actual dosing was fractionated as above, for saftey's sake)  As each dose occurred, patient was free of IV sx; and patient exhibited no evidence of SA injection.  Patient is more comfortable after epidural dosed. Please see RN's note for documentation of vital signs,and FHR which are stable.  Patient reminded not to try to ambulate with numb legs, and that an RN must be present when she attempts to get up.

## 2014-04-21 ENCOUNTER — Telehealth: Payer: Self-pay | Admitting: Hematology & Oncology

## 2014-04-21 NOTE — Telephone Encounter (Signed)
ST DISABILITY and FMLA papers complete.  Pt's hubby Arlys John(Brian) picked them up for her.        COPY SCANNED

## 2014-04-29 ENCOUNTER — Ambulatory Visit: Payer: BC Managed Care – PPO | Admitting: Family

## 2014-04-29 ENCOUNTER — Other Ambulatory Visit: Payer: BC Managed Care – PPO | Admitting: Lab

## 2014-05-04 ENCOUNTER — Encounter (HOSPITAL_COMMUNITY): Payer: Self-pay

## 2014-05-06 ENCOUNTER — Other Ambulatory Visit (HOSPITAL_BASED_OUTPATIENT_CLINIC_OR_DEPARTMENT_OTHER): Payer: BC Managed Care – PPO | Admitting: Lab

## 2014-05-06 ENCOUNTER — Ambulatory Visit (HOSPITAL_BASED_OUTPATIENT_CLINIC_OR_DEPARTMENT_OTHER): Payer: BC Managed Care – PPO | Admitting: Family

## 2014-05-06 DIAGNOSIS — D6801 Von willebrand disease, type 1: Secondary | ICD-10-CM

## 2014-05-06 DIAGNOSIS — D680 Von Willebrand's disease: Secondary | ICD-10-CM

## 2014-05-06 LAB — CBC WITH DIFFERENTIAL (CANCER CENTER ONLY)
BASO#: 0 10*3/uL (ref 0.0–0.2)
BASO%: 0.2 % (ref 0.0–2.0)
EOS%: 1.5 % (ref 0.0–7.0)
Eosinophils Absolute: 0.1 10*3/uL (ref 0.0–0.5)
HCT: 37.5 % (ref 34.8–46.6)
HGB: 12.9 g/dL (ref 11.6–15.9)
LYMPH#: 1.5 10*3/uL (ref 0.9–3.3)
LYMPH%: 25 % (ref 14.0–48.0)
MCH: 28.2 pg (ref 26.0–34.0)
MCHC: 34.4 g/dL (ref 32.0–36.0)
MCV: 82 fL (ref 81–101)
MONO#: 0.7 10*3/uL (ref 0.1–0.9)
MONO%: 11 % (ref 0.0–13.0)
NEUT#: 3.7 10*3/uL (ref 1.5–6.5)
NEUT%: 62.3 % (ref 39.6–80.0)
PLATELETS: 250 10*3/uL (ref 145–400)
RBC: 4.58 10*6/uL (ref 3.70–5.32)
RDW: 13.8 % (ref 11.1–15.7)
WBC: 6 10*3/uL (ref 3.9–10.0)

## 2014-05-06 MED ORDER — LACTULOSE 10 GM/15ML PO SOLN: 10.0000 g | Freq: Three times a day (TID) | ORAL | Status: DC

## 2014-05-06 NOTE — Progress Notes (Signed)
[place]  [phone] [phone]  ID: JUN RIGHTMYER OB: 1986-01-01 MR#: [id number] GMW#:[id number] Patient Care Team: Logan Bores, MD as PCP - General (Obstetrics and Gynecology)  DIAGNOSIS:  Type 1 von Willebrand  Postpartum pregnancy  INTERVAL HISTORY: Ms.Shein is here today for a follow-up. Her baby girl, Jodi Mourning, is precious. She is feeling ok. She has had some bleeding periodically that stops on its own. Her main issue right now is constipation and has had some blood in her stool from straining. She denies fever, chills,n/v, cough, rash, headache, dizziness, SOB, chest pain, palpitations, abdominal pain, diarrhea, blood in urine. No swelling, tenderness, numbness or tingling in her extremities. Her appetite is good and she is staying hydrated.     In July, her von Willebrand level was 128. Her factor VIII level was 278. Her Ristocetin Co-factor was 98%. We will continue to monitor this.   CURRENT TREATMENT: Observation  REVIEW OF SYSTEMS: All other 10 point review of systems is negative.   PAST MEDICAL HISTORY: Past Medical History  Diagnosis Date  . Von Willebrand disease   . Depression     PAST SURGICAL HISTORY: Past Surgical History  Procedure Laterality Date  . Tonsillectomy      and adenoid    FAMILY HISTORY Family History  Problem Relation Age of Onset  . Birth defects Sister     MVP  . Birth defects Son     hypoplastic heart    GYNECOLOGIC HISTORY:  No LMP recorded.   SOCIAL HISTORY: History   Social History  . Marital Status: Married    Spouse Name: N/A    Number of Children: N/A  . Years of Education: N/A   Occupational History  . Not on file.   Social History Main Topics  . Smoking status: Former Smoker -- 0.25 packs/day for 15 years    Types: Cigarettes    Start date: 04/01/1997    Quit date: 08/01/2012  . Smokeless tobacco: Never Used     Comment: quit 14 months ago  . Alcohol Use: No  . Drug  Use: Not on file  . Sexual Activity: Not on file   Other Topics Concern  . Not on file   Social History Narrative    ADVANCED DIRECTIVES:  <no information>  HEALTH MAINTENANCE: History  Substance Use Topics  . Smoking status: Former Smoker -- 0.25 packs/day for 15 years    Types: Cigarettes    Start date: 04/01/1997    Quit date: 08/01/2012  . Smokeless tobacco: Never Used     Comment: quit 14 months ago  . Alcohol Use: No   Colonoscopy: PAP: Bone density: Lipid panel:  No Known Allergies  Current Outpatient Prescriptions  Medication Sig Dispense Refill  . docusate sodium (COLACE) 100 MG capsule Take 100 mg by mouth 3 (three) times daily.    . Prenatal Vit-Fe Fumarate-FA (PRENATAL MULTIVITAMIN) TABS tablet Take 1 tablet by mouth daily at 12 noon.    . lactulose (CHRONULAC) 10 GM/15ML solution Take 15 mLs (10 g total) by mouth 3 (three) times daily. 240 mL 0   No current facility-administered medications for this visit.    OBJECTIVE: There were no vitals filed for this visit. There were no vitals filed for this visit. ECOG FS:0 - Asymptomatic Ocular: Sclerae unicteric, pupils equal, round and reactive to light Ear-nose-throat: Oropharynx clear, dentition fair Lymphatic: No cervical or supraclavicular adenopathy Lungs no rales or rhonchi, good excursion bilaterally Heart regular rate  and rhythm, no murmur appreciated Abd soft, nontender, positive bowel sounds MSK no focal spinal tenderness, no joint edema Neuro: non-focal, well-oriented, appropriate affect Breasts: Deferred  LAB RESULTS: CMP     Component Value Date/Time   NA 139 02/17/2010 1639   K 4.1 02/17/2010 1639   CL 107 02/17/2010 1639   CO2 21 02/17/2010 1639   GLUCOSE 71 02/17/2010 1639   BUN 12 02/17/2010 1639   CREATININE 0.5 02/17/2010 1639   CALCIUM 9.1 02/17/2010 1639   PROT 7.0 02/17/2010 1639   ALBUMIN 3.9 02/17/2010 1639   AST 15 02/17/2010 1639   ALT 10 02/17/2010 1639   ALKPHOS 67  02/17/2010 1639   BILITOT 0.6 02/17/2010 1639   GFRNONAA 158.66 02/17/2010 1639   GFRAA  09/07/2009 1920    >60        The eGFR has been calculated using the MDRD equation. This calculation has not been validated in all clinical situations. eGFR's persistently <60 mL/min signify possible Chronic Kidney Disease.   INo results found for: SPEP, UPEP Lab Results  Component Value Date   WBC 6.0 05/06/2014   NEUTROABS 3.7 05/06/2014   HGB 12.9 05/06/2014   HCT 37.5 05/06/2014   MCV 82 05/06/2014   PLT 250 05/06/2014   No results found for: LABCA2 No components found for: AESLP530 No results for input(s): INR in the last 168 hours.  STUDIES: No results found.  ASSESSMENT/PLAN: Ms. Shiffman is 28 year old female with von Willebrand's disease. Her level have continue to come down. We will wait and see what today's show.  Her CBC was normal.  We probably need to have her stay out of work until the holidays are over. She needs to be at rest and just be with her baby. Working could potentially cause some exacerbation of her von Willebrand's and put her at risk for bleeding. We will see her back in 3 months for labs and follow-up.  She knows to call here with any questions or concerns and to go to the ED in the event of an emergency. We can certainly see her sooner if need be.    Eliezer Bottom, NP 05/06/2014 4:09 PM

## 2014-05-11 LAB — VON WILLEBRAND PANEL
COAGULATION FACTOR VIII: 124 % (ref 73–140)
RISTOCETIN CO-FACTOR, PLASMA: 87 % (ref 42–200)
Von Willebrand Antigen, Plasma: 95 % (ref 50–217)

## 2014-05-26 ENCOUNTER — Other Ambulatory Visit: Payer: Self-pay | Admitting: *Deleted

## 2014-05-26 DIAGNOSIS — D680 Von Willebrand's disease: Secondary | ICD-10-CM

## 2014-05-26 DIAGNOSIS — D6801 Von willebrand disease, type 1: Secondary | ICD-10-CM

## 2014-05-27 ENCOUNTER — Encounter: Payer: BC Managed Care – PPO | Admitting: Hematology & Oncology

## 2014-05-27 ENCOUNTER — Other Ambulatory Visit: Payer: BC Managed Care – PPO | Admitting: Lab

## 2014-05-29 ENCOUNTER — Telehealth: Payer: Self-pay | Admitting: Hematology & Oncology

## 2014-05-29 NOTE — Telephone Encounter (Signed)
Left pt message to call and reschedule missed 11-25

## 2014-05-29 NOTE — Progress Notes (Signed)
This encounter was created in error - please disregard.

## 2014-07-02 ENCOUNTER — Encounter: Payer: Self-pay | Admitting: Nurse Practitioner

## 2014-07-02 NOTE — Progress Notes (Signed)
As requested a letter to return to work was sent to [phone]. Also STD forms have been completed and sent to the specified benefit specialist at the mentioned number as well.

## 2015-07-04 NOTE — L&D Delivery Note (Addendum)
Delivery Note Pt progressed rapidly to 9cm then had AROM of BBOW for light meconium.  She was then complete within 15 min and pushed great.  At 1:29 PM a healthy female was delivered via NSVD  (Presentation: OA  ).  APGAR: 9, 9; weight pending.   Placenta status: delivered spontaneously ,intact .  Cord:  with the following complications: loose nuchal x 1 reduced.   Anesthesia: epidural  Episiotomy:  none Lacerations: none  Suture Repair: n/a Est. Blood Loss (mL):  125ml  Mom to postpartum.  Baby to Couplet care / Skin to Skin. Pt with normal bleeding.  Will follow pp for any excess and pharmacy has ddAVP available if needed.   Vanessa [name],[name] 06/20/2016, 1:42 PM

## 2015-11-11 LAB — OB RESULTS CONSOLE ABO/RH: RH TYPE: POSITIVE

## 2015-11-11 LAB — OB RESULTS CONSOLE GC/CHLAMYDIA
CHLAMYDIA, DNA PROBE: NEGATIVE
Gonorrhea: NEGATIVE

## 2015-11-11 LAB — OB RESULTS CONSOLE ANTIBODY SCREEN: ANTIBODY SCREEN: NEGATIVE

## 2015-11-11 LAB — OB RESULTS CONSOLE RPR: RPR: NONREACTIVE

## 2015-11-11 LAB — OB RESULTS CONSOLE RUBELLA ANTIBODY, IGM: Rubella: IMMUNE

## 2015-11-11 LAB — OB RESULTS CONSOLE HIV ANTIBODY (ROUTINE TESTING): HIV: NONREACTIVE

## 2015-11-11 LAB — OB RESULTS CONSOLE HEPATITIS B SURFACE ANTIGEN: HEP B S AG: NEGATIVE

## 2016-01-18 ENCOUNTER — Other Ambulatory Visit: Payer: Self-pay | Admitting: Family

## 2016-01-18 DIAGNOSIS — D6801 Von willebrand disease, type 1: Secondary | ICD-10-CM

## 2016-01-18 DIAGNOSIS — D680 Von Willebrand's disease: Secondary | ICD-10-CM

## 2016-01-19 ENCOUNTER — Ambulatory Visit (HOSPITAL_BASED_OUTPATIENT_CLINIC_OR_DEPARTMENT_OTHER): Payer: Commercial Managed Care - PPO | Admitting: Family

## 2016-01-19 ENCOUNTER — Other Ambulatory Visit (HOSPITAL_BASED_OUTPATIENT_CLINIC_OR_DEPARTMENT_OTHER): Payer: Commercial Managed Care - PPO

## 2016-01-19 ENCOUNTER — Encounter: Payer: Self-pay | Admitting: Family

## 2016-01-19 VITALS — BP 112/65 | HR 79 | Temp 98.4°F | Resp 18 | Ht 65.0 in | Wt 188.0 lb

## 2016-01-19 DIAGNOSIS — D680 Von Willebrand's disease: Secondary | ICD-10-CM | POA: Diagnosis not present

## 2016-01-19 DIAGNOSIS — Z3492 Encounter for supervision of normal pregnancy, unspecified, second trimester: Secondary | ICD-10-CM

## 2016-01-19 DIAGNOSIS — D6801 Von willebrand disease, type 1: Secondary | ICD-10-CM

## 2016-01-19 LAB — CBC WITH DIFFERENTIAL (CANCER CENTER ONLY)
BASO#: 0 10*3/uL (ref 0.0–0.2)
BASO%: 0 % (ref 0.0–2.0)
EOS ABS: 0.1 10*3/uL (ref 0.0–0.5)
EOS%: 1.1 % (ref 0.0–7.0)
HCT: 34.2 % — ABNORMAL LOW (ref 34.8–46.6)
HGB: 11.9 g/dL (ref 11.6–15.9)
LYMPH#: 1.1 10*3/uL (ref 0.9–3.3)
LYMPH%: 17.4 % (ref 14.0–48.0)
MCH: 28.3 pg (ref 26.0–34.0)
MCHC: 34.8 g/dL (ref 32.0–36.0)
MCV: 81 fL (ref 81–101)
MONO#: 0.4 10*3/uL (ref 0.1–0.9)
MONO%: 6.8 % (ref 0.0–13.0)
NEUT#: 4.8 10*3/uL (ref 1.5–6.5)
NEUT%: 74.7 % (ref 39.6–80.0)
Platelets: 237 10*3/uL (ref 145–400)
RBC: 4.2 10*6/uL (ref 3.70–5.32)
RDW: 13.9 % (ref 11.1–15.7)
WBC: 6.4 10*3/uL (ref 3.9–10.0)

## 2016-01-19 NOTE — Progress Notes (Signed)
Hematology and Oncology Follow Up Visit  Daun PeacockLisa C Schwoerer [id number] 1985/10/20 30 y.o. 01/19/2016   Principle Diagnosis:  Type 1 Von Willebrand Second trimester of pregnancy  Current Therapy:   Observation    Interim History:  [name] is here today for follow-up. She is now in her second trimester and is doing quite well. Her due date is December 20th.  She has had no episodes of bleeding or bruising. Her Von Willebrand panel is pending.  No fever, chills, cough, rash, dizziness, SOB, chest pain, palpitations, abdominal pain/cramping, or changes in bowel or bladder habits.  No swelling, tenderness, numbness or tingling in her extremities. She has maintained a good appetite and is staying well hydrated. She has not experienced any n/v. Her weight is stable.   Medications:    Medication List       This list is accurate as of: 01/19/16  3:40 PM.  Always use your most recent med list.               prenatal multivitamin Tabs tablet  Take 1 tablet by mouth daily at 12 noon.        Allergies: No Known Allergies  Past Medical History, Surgical history, Social history, and Family History were reviewed and updated.  Review of Systems: All other 10 point review of systems is negative.   Physical Exam:  height is 5\' 5"  (1.651 m) and weight is 188 lb (85.276 kg). Her oral temperature is 98.4 F (36.9 C). Her blood pressure is 112/65 and her pulse is 79. Her respiration is 18.   Wt Readings from Last 3 Encounters:  01/19/16 188 lb (85.276 kg)  01/28/14 188 lb (85.276 kg)  01/21/14 209 lb (94.802 kg)    Ocular: Sclerae unicteric, pupils equal, round and reactive to light Ear-nose-throat: Oropharynx clear, dentition fair Lymphatic: No cervical supraclavicular or axillary adenopathy Lungs no rales or rhonchi, good excursion bilaterally Heart regular rate and rhythm, no murmur appreciated Abd soft, nontender, positive bowel sounds MSK no focal spinal tenderness, no joint  edema Neuro: non-focal, well-oriented, appropriate affect Breasts:   Lab Results  Component Value Date   WBC 6.4 01/19/2016   HGB 11.9 01/19/2016   HCT 34.2* 01/19/2016   MCV 81 01/19/2016   PLT 237 01/19/2016   Lab Results  Component Value Date   FERRITIN 26 03/17/2009   IRON 60 03/17/2009   TIBC 417 03/17/2009   UIBC 357 03/17/2009   IRONPCTSAT 14* 03/17/2009   Lab Results  Component Value Date   RBC 4.20 01/19/2016   No results found for: KPAFRELGTCHN, LAMBDASER, KAPLAMBRATIO No results found for: IGGSERUM, IGA, IGMSERUM No results found for: Dorene ArOTALPROTELP, ALBUMINELP, A1GS, A2GS, Karn PicklerBETS, BETA2SER, GAMS, MSPIKE, SPEI   Chemistry      Component Value Date/Time   NA 139 02/17/2010 1639   K 4.1 02/17/2010 1639   CL 107 02/17/2010 1639   CO2 21 02/17/2010 1639   BUN 12 02/17/2010 1639   CREATININE 0.5 02/17/2010 1639      Component Value Date/Time   CALCIUM 9.1 02/17/2010 1639   ALKPHOS 67 02/17/2010 1639   AST 15 02/17/2010 1639   ALT 10 02/17/2010 1639   BILITOT 0.6 02/17/2010 1639     Impression and Plan: [name] is 30 yo female with von Willebrand's disease. We have seen her in the past an monitored her during pregnancy. She now in her second trimester of pregnancy. So far she has done well and had no episodes of  bleeding or bruising. We will continue to follow along with her.    Her Von Willebrand panel is pending. Once we have all her results we will send them to her OB Dr. Huel Cote.  We will plan to see her back in October for repeat labs and follow-up.  She knows to call here with any questions or concerns and to go to the ED in the event of an emergency. We can certainly see her sooner if need be.   Verdie Mosher, NP 7/19/20173:40 PM

## 2016-01-20 LAB — VON WILLEBRAND PANEL
Factor VIII Activity: 163 % (ref 57–163)
PDF Image: 0
vWF Activity: 81 % (ref 50–200)
von Willebrand Factor (vWF) Ag: 112 % (ref 50–200)

## 2016-01-20 LAB — APTT: aPTT: 29 s (ref 24–33)

## 2016-04-28 ENCOUNTER — Other Ambulatory Visit: Payer: Commercial Managed Care - PPO

## 2016-04-28 ENCOUNTER — Ambulatory Visit: Payer: Commercial Managed Care - PPO | Admitting: Family

## 2016-05-04 ENCOUNTER — Other Ambulatory Visit: Payer: Commercial Managed Care - PPO

## 2016-05-04 ENCOUNTER — Ambulatory Visit: Payer: Commercial Managed Care - PPO | Admitting: Family

## 2016-05-22 ENCOUNTER — Ambulatory Visit (HOSPITAL_BASED_OUTPATIENT_CLINIC_OR_DEPARTMENT_OTHER): Payer: Commercial Managed Care - PPO | Admitting: Family

## 2016-05-22 ENCOUNTER — Other Ambulatory Visit (HOSPITAL_BASED_OUTPATIENT_CLINIC_OR_DEPARTMENT_OTHER): Payer: Commercial Managed Care - PPO

## 2016-05-22 VITALS — BP 117/67 | HR 77 | Temp 98.0°F | Resp 20 | Wt 205.0 lb

## 2016-05-22 DIAGNOSIS — D680 Von Willebrand's disease: Secondary | ICD-10-CM | POA: Diagnosis not present

## 2016-05-22 DIAGNOSIS — Z3493 Encounter for supervision of normal pregnancy, unspecified, third trimester: Secondary | ICD-10-CM

## 2016-05-22 DIAGNOSIS — O99113 Other diseases of the blood and blood-forming organs and certain disorders involving the immune mechanism complicating pregnancy, third trimester: Secondary | ICD-10-CM | POA: Diagnosis not present

## 2016-05-22 DIAGNOSIS — D6801 Von willebrand disease, type 1: Secondary | ICD-10-CM

## 2016-05-22 DIAGNOSIS — Z3492 Encounter for supervision of normal pregnancy, unspecified, second trimester: Secondary | ICD-10-CM

## 2016-05-22 LAB — CBC WITH DIFFERENTIAL (CANCER CENTER ONLY)
BASO#: 0 10*3/uL (ref 0.0–0.2)
BASO%: 0.1 % (ref 0.0–2.0)
EOS ABS: 0 10*3/uL (ref 0.0–0.5)
EOS%: 0.5 % (ref 0.0–7.0)
HEMATOCRIT: 33.3 % — AB (ref 34.8–46.6)
HGB: 11.4 g/dL — ABNORMAL LOW (ref 11.6–15.9)
LYMPH#: 1.2 10*3/uL (ref 0.9–3.3)
LYMPH%: 16.2 % (ref 14.0–48.0)
MCH: 27.3 pg (ref 26.0–34.0)
MCHC: 34.2 g/dL (ref 32.0–36.0)
MCV: 80 fL — AB (ref 81–101)
MONO#: 0.5 10*3/uL (ref 0.1–0.9)
MONO%: 6.6 % (ref 0.0–13.0)
NEUT#: 5.7 10*3/uL (ref 1.5–6.5)
NEUT%: 76.6 % (ref 39.6–80.0)
Platelets: 195 10*3/uL (ref 145–400)
RBC: 4.17 10*6/uL (ref 3.70–5.32)
RDW: 14.1 % (ref 11.1–15.7)
WBC: 7.4 10*3/uL (ref 3.9–10.0)

## 2016-05-22 NOTE — Progress Notes (Signed)
Hematology and Oncology Follow Up Visit  Vanessa PeacockLisa C [name] [id number] Apr 06, 1986 30 y.o. 05/22/2016   Principle Diagnosis:  Type 1 Von Willebrand Third trimester of pregnancy  Current Therapy:   Observation    Interim History:  [name] is here today for follow-up. She is now [redacted] weeks pregnant, due on December 20th. Her VW counts have been stable. Today's results are pending.  So far, she has had a good pregnancy with no bleeding or spotting. She had no bleeding with her other two pregnancies. She has been having some Braxton Hicks contractions.  No fever, chills, cough, rash, dizziness, SOB, chest pain, palpitations or changes in bowel or bladder habits.  No swelling, tenderness, numbness or tingling in her extremities. She has maintained a good appetite and is staying well hydrated. She has not experienced any n/v. Her weight is stable.   Medications:    Medication List       Accurate as of 05/22/16  4:07 PM. Always use your most recent med list.          prenatal multivitamin Tabs tablet Take 1 tablet by mouth daily at 12 noon.       Allergies: No Known Allergies  Past Medical History, Surgical history, Social history, and Family History were reviewed and updated.  Review of Systems: All other 10 point review of systems is negative.   Physical Exam:  weight is 205 lb (93 kg). Her oral temperature is 98 F (36.7 C). Her blood pressure is 117/67 and her pulse is 77. Her respiration is 20.   Wt Readings from Last 3 Encounters:  05/22/16 205 lb (93 kg)  01/19/16 188 lb (85.3 kg)  01/28/14 188 lb (85.3 kg)    Ocular: Sclerae unicteric, pupils equal, round and reactive to light Ear-nose-throat: Oropharynx clear, dentition fair Lymphatic: No cervical supraclavicular or axillary adenopathy Lungs no rales or rhonchi, good excursion bilaterally Heart regular rate and rhythm, no murmur appreciated Abd soft, nontender, positive bowel sounds, no liver or spleen tip  palpated on exam MSK no focal spinal tenderness, no joint edema Neuro: non-focal, well-oriented, appropriate affect Breasts: Deferred   Lab Results  Component Value Date   WBC 7.4 05/22/2016   HGB 11.4 (L) 05/22/2016   HCT 33.3 (L) 05/22/2016   MCV 80 (L) 05/22/2016   PLT 195 05/22/2016   Lab Results  Component Value Date   FERRITIN 26 03/17/2009   IRON 60 03/17/2009   TIBC 417 03/17/2009   UIBC 357 03/17/2009   IRONPCTSAT 14 (L) 03/17/2009   Lab Results  Component Value Date   RBC 4.17 05/22/2016   No results found for: KPAFRELGTCHN, LAMBDASER, KAPLAMBRATIO No results found for: IGGSERUM, IGA, IGMSERUM No results found for: Dorene ArOTALPROTELP, ALBUMINELP, A1GS, A2GS, Karn PicklerBETS, BETA2SER, GAMS, MSPIKE, SPEI   Chemistry      Component Value Date/Time   NA 139 02/17/2010 1639   K 4.1 02/17/2010 1639   CL 107 02/17/2010 1639   CO2 21 02/17/2010 1639   BUN 12 02/17/2010 1639   CREATININE 0.5 02/17/2010 1639      Component Value Date/Time   CALCIUM 9.1 02/17/2010 1639   ALKPHOS 67 02/17/2010 1639   AST 15 02/17/2010 1639   ALT 10 02/17/2010 1639   BILITOT 0.6 02/17/2010 1639     Impression and Plan: [name] is 30 yo female with von Willebrand's disease. We have seen her in the past an monitored her during pregnancy. She now in her third trimester and is due  in 4 weeks on December 20th. Her VW counts have stayed within normal range so far. Today's labs are pending. Once results are available will send to her OB.  We will plan to see her back in 3 weeks, one week before she is to give birth, to make sure everything still looks good.  She will contact our office with any questions or concerns and to go to the ED in the event of an emergency. We can certainly see her sooner if need be.   Verdie MosherINCINNATI,Moss Berry M, NP 11/20/20174:07 PM

## 2016-05-23 LAB — VON WILLEBRAND PANEL
Factor VIII Activity: 195 % — ABNORMAL HIGH (ref 57–163)
VON WILLEBRAND FACTOR: 139 % (ref 50–200)
von Willebrand Factor (vWF) Ag: 180 % (ref 50–200)

## 2016-05-23 LAB — APTT: aPTT: 28 s (ref 24–33)

## 2016-05-24 LAB — OB RESULTS CONSOLE GBS: GBS: POSITIVE

## 2016-06-12 ENCOUNTER — Ambulatory Visit (HOSPITAL_BASED_OUTPATIENT_CLINIC_OR_DEPARTMENT_OTHER): Payer: Commercial Managed Care - PPO | Admitting: Family

## 2016-06-12 ENCOUNTER — Other Ambulatory Visit (HOSPITAL_BASED_OUTPATIENT_CLINIC_OR_DEPARTMENT_OTHER): Payer: Commercial Managed Care - PPO

## 2016-06-12 ENCOUNTER — Encounter: Payer: Self-pay | Admitting: Family

## 2016-06-12 VITALS — BP 122/81 | HR 92 | Temp 98.5°F | Wt 207.0 lb

## 2016-06-12 DIAGNOSIS — D680 Von Willebrand's disease: Secondary | ICD-10-CM

## 2016-06-12 DIAGNOSIS — Z3493 Encounter for supervision of normal pregnancy, unspecified, third trimester: Secondary | ICD-10-CM

## 2016-06-12 DIAGNOSIS — O99113 Other diseases of the blood and blood-forming organs and certain disorders involving the immune mechanism complicating pregnancy, third trimester: Secondary | ICD-10-CM | POA: Diagnosis not present

## 2016-06-12 DIAGNOSIS — D6801 Von willebrand disease, type 1: Secondary | ICD-10-CM

## 2016-06-12 LAB — CBC WITH DIFFERENTIAL (CANCER CENTER ONLY)
BASO#: 0 10*3/uL (ref 0.0–0.2)
BASO%: 0.1 % (ref 0.0–2.0)
EOS%: 0.6 % (ref 0.0–7.0)
Eosinophils Absolute: 0.1 10*3/uL (ref 0.0–0.5)
HCT: 35 % (ref 34.8–46.6)
HGB: 12.1 g/dL (ref 11.6–15.9)
LYMPH#: 1 10*3/uL (ref 0.9–3.3)
LYMPH%: 12.3 % — AB (ref 14.0–48.0)
MCH: 27.6 pg (ref 26.0–34.0)
MCHC: 34.6 g/dL (ref 32.0–36.0)
MCV: 80 fL — ABNORMAL LOW (ref 81–101)
MONO#: 0.6 10*3/uL (ref 0.1–0.9)
MONO%: 7.2 % (ref 0.0–13.0)
NEUT#: 6.5 10*3/uL (ref 1.5–6.5)
NEUT%: 79.8 % (ref 39.6–80.0)
PLATELETS: 217 10*3/uL (ref 145–400)
RBC: 4.39 10*6/uL (ref 3.70–5.32)
RDW: 14.5 % (ref 11.1–15.7)
WBC: 8.1 10*3/uL (ref 3.9–10.0)

## 2016-06-12 NOTE — Progress Notes (Signed)
Hematology and Oncology Follow Up Visit  Vanessa PeacockLisa C Wentland [id number] 1986-06-05 30 y.o. 06/12/2016   Principle Diagnosis:  Type 1 Von Willebrand Third trimester of pregnancy  Current Therapy:   Observation    Interim History:  [name] is here today for follow-up. She has one week left until her due date! She is nervous but excited. So far, her Vanessa PeltonBraxton Hicks contractions are the same. She has no bleeding or spotting. Her Von Willebrand levels have been stable so far.  We will plan for her to receive DDAVP the day after delivery if she has a C-section. If she has a vaginal birth, she will not require DDAVP unless she experiences unplanned bleeding afterwards per Dr. Myna HidalgoEnnever.  She will need to have her Von Willebrand panel checked the day after delivery.  No fever, chills, n/v, cough, rash, dizziness, SOB, chest pain, palpitations or changes in bowel or bladder habits.  No swelling, tenderness, numbness or tingling in her extremities at this time.  She has maintained a good appetite and is staying well hydrated. She has not experienced any n/v. Her weight is stable.   Medications:    Medication List       Accurate as of 06/12/16  2:44 PM. Always use your most recent med list.          prenatal multivitamin Tabs tablet Take 1 tablet by mouth daily at 12 noon.       Allergies: No Known Allergies  Past Medical History, Surgical history, Social history, and Family History were reviewed and updated.  Review of Systems: All other 10 point review of systems is negative.   Physical Exam:  vitals were not taken for this visit.  Wt Readings from Last 3 Encounters:  05/22/16 205 lb (93 kg)  01/19/16 188 lb (85.3 kg)  01/28/14 188 lb (85.3 kg)    Ocular: Sclerae unicteric, pupils equal, round and reactive to light Ear-nose-throat: Oropharynx clear, dentition fair Lymphatic: No cervical supraclavicular or axillary adenopathy Lungs no rales or rhonchi, good excursion  bilaterally Heart regular rate and rhythm, no murmur appreciated Abd soft, nontender, positive bowel sounds, no liver or spleen tip palpated on exam MSK no focal spinal tenderness, no joint edema Neuro: non-focal, well-oriented, appropriate affect Breasts: Deferred   Lab Results  Component Value Date   WBC 7.4 05/22/2016   HGB 11.4 (L) 05/22/2016   HCT 33.3 (L) 05/22/2016   MCV 80 (L) 05/22/2016   PLT 195 05/22/2016   Lab Results  Component Value Date   FERRITIN 26 03/17/2009   IRON 60 03/17/2009   TIBC 417 03/17/2009   UIBC 357 03/17/2009   IRONPCTSAT 14 (L) 03/17/2009   Lab Results  Component Value Date   RBC 4.17 05/22/2016   No results found for: KPAFRELGTCHN, LAMBDASER, KAPLAMBRATIO No results found for: IGGSERUM, IGA, IGMSERUM No results found for: Dorene ArOTALPROTELP, ALBUMINELP, A1GS, A2GS, Karn PicklerBETS, BETA2SER, GAMS, MSPIKE, SPEI   Chemistry      Component Value Date/Time   NA 139 02/17/2010 1639   K 4.1 02/17/2010 1639   CL 107 02/17/2010 1639   CO2 21 02/17/2010 1639   BUN 12 02/17/2010 1639   CREATININE 0.5 02/17/2010 1639      Component Value Date/Time   CALCIUM 9.1 02/17/2010 1639   ALKPHOS 67 02/17/2010 1639   AST 15 02/17/2010 1639   ALT 10 02/17/2010 1639   BILITOT 0.6 02/17/2010 1639     Impression and Plan: [name] is 30 yo female with von  Willebrand's disease. We have seen her in the past and monitored her during pregnancy. She now in her third trimester and her due date is in 1 week on December 20th. She has done well and so far, her VW levels have been stable. Today's results are pending. CBC is stable no anemia.  As mentioned above, if she has a C-section she will need to receive DDAVP the day after. If she has a vaginal birth, she will not require DDAVP unless she experiences unplanned bleeding afterwards.  She will need to have a Von Willebrand panel checked the day after birth.  I will route this note to her gynecologist Dr. Huel CoteKathy Richardson.  We  will plan to see her back in 2 months for repeat lab work and follow-up.  She will contact our office with any questions or concerns. We can certainly see her sooner if need be.   Verdie MosherINCINNATI,SARAH M, NP 12/11/20172:44 PM

## 2016-06-13 LAB — APTT: aPTT: 28 s (ref 24–33)

## 2016-06-14 ENCOUNTER — Encounter (HOSPITAL_COMMUNITY): Payer: Self-pay | Admitting: *Deleted

## 2016-06-14 ENCOUNTER — Telehealth (HOSPITAL_COMMUNITY): Payer: Self-pay | Admitting: *Deleted

## 2016-06-14 ENCOUNTER — Inpatient Hospital Stay (HOSPITAL_COMMUNITY): Payer: Commercial Managed Care - PPO

## 2016-06-14 LAB — VON WILLEBRAND PANEL
Factor VIII Activity: 216 % — ABNORMAL HIGH (ref 57–163)
VON WILLEBRAND FACTOR: 140 % (ref 50–200)
von Willebrand Factor (vWF) Ag: 191 % (ref 50–200)

## 2016-06-14 NOTE — Telephone Encounter (Signed)
Preadmission screen  

## 2016-06-15 ENCOUNTER — Encounter (HOSPITAL_COMMUNITY): Payer: Self-pay | Admitting: *Deleted

## 2016-06-19 NOTE — H&P (Signed)
Vanessa PeacockLisa C [name] is a 30 y.o. female [id number] at 4639 6/7 weeks (EDD 06/21/17 by 8 week US)   presenting for IOL at term.  Prenatal care complicated by h/o von Willebrand's disease, followed by hematology and levels have been good, last checked 2 weeks prior to admission.  Recommended to only use ddAVP in event of bleeding after NSVD or on POD #1 if needs c-section.  She has never needed in the past. She has a h/o of her first child having a hypoplastic left heart defect and dying on DOL #3.  This pregnancy the baby had a normal fetal  ECHO. She is also GBS positive.   OB History    Gravida Para Term Preterm AB Living   6 3 3  0 2 2   SAB TAB Ectopic Multiple Live Births   2 0 0 0 3    2007 SVD 7#14oz, died DOL #3 2011 SVD 7#13oz  842015 SVD  8#6oz   Past Medical History:  Diagnosis Date  . Depression   . Von Willebrand disease (HCC)    Past Surgical History:  Procedure Laterality Date  . TONSILLECTOMY     and adenoid   Family History: family history includes Birth defects in her sister and son; Heart disease in her father; Hypertension in her father. Social History:  reports that she quit smoking about 3 years ago. Her smoking use included Cigarettes. She started smoking about 19 years ago. She has a 3.75 pack-year smoking history. She has never used smokeless tobacco. She reports that she does not drink alcohol or use drugs.     Maternal Diabetes: No Genetic Screening: Declined Maternal Ultrasounds/Referrals: Normal Fetal Ultrasounds or other Referrals:  Fetal echo Maternal Substance Abuse:  No Significant Maternal Medications:  None Significant Maternal Lab Results:  Lab values include: Group B Strep positive Other Comments:  None  Review of Systems  Eyes: Positive for blurred vision.  Gastrointestinal: Negative for abdominal pain.   Maternal Medical History:  Contractions: Frequency: irregular.   Perceived severity is mild.    Fetal activity: Perceived fetal activity is  normal.    Prenatal Complications - Diabetes: none.      Last menstrual period 09/05/2015, unknown if currently breastfeeding. Maternal Exam:  Uterine Assessment: Contraction strength is mild.  Contraction frequency is irregular.   Abdomen: Patient reports no abdominal tenderness. Fetal presentation: vertex  Introitus: Normal vulva. Normal vagina.  Pelvis: adequate for delivery.      Physical Exam  Constitutional: She appears well-developed.  Cardiovascular: Normal rate and regular rhythm.   Respiratory: Effort normal.  GI: Soft.  Genitourinary: Vagina normal.  Neurological: She is alert.  Psychiatric: She has a normal mood and affect.    Prenatal labs: ABO, Rh: A/Positive/-- (05/11 0000) Antibody: Negative (05/11 0000) Rubella: Immune (05/11 0000) RPR: Nonreactive (05/11 0000)  HBsAg: Negative (05/11 0000)  HIV: Non-reactive (05/11 0000)  GBS: Positive (11/22 0000)  One hour GCT 1125  Assessment/Plan: Pt for IOL at term. Plan pitocin.   PCN for GBS+ and will AROM after on board.  Oliver PilaRICHARDSON,Chantal Worthey W 06/19/2016, 9:40 PM

## 2016-06-20 ENCOUNTER — Inpatient Hospital Stay (HOSPITAL_COMMUNITY)
Admission: RE | Admit: 2016-06-20 | Discharge: 2016-06-22 | DRG: 775 | Disposition: A | Discharge status: Home / Self Care | Payer: Commercial Managed Care - PPO | Source: Ambulatory Visit | Attending: Obstetrics and Gynecology | Admitting: Obstetrics and Gynecology

## 2016-06-20 ENCOUNTER — Inpatient Hospital Stay (HOSPITAL_COMMUNITY): Payer: Commercial Managed Care - PPO | Admitting: Anesthesiology

## 2016-06-20 ENCOUNTER — Encounter (HOSPITAL_COMMUNITY): Payer: Self-pay

## 2016-06-20 DIAGNOSIS — O99824 Streptococcus B carrier state complicating childbirth: Secondary | ICD-10-CM | POA: Diagnosis present

## 2016-06-20 DIAGNOSIS — O9912 Other diseases of the blood and blood-forming organs and certain disorders involving the immune mechanism complicating childbirth: Principal | ICD-10-CM | POA: Diagnosis present

## 2016-06-20 DIAGNOSIS — Z3490 Encounter for supervision of normal pregnancy, unspecified, unspecified trimester: Secondary | ICD-10-CM

## 2016-06-20 DIAGNOSIS — D680 Von Willebrand's disease: Secondary | ICD-10-CM | POA: Diagnosis present

## 2016-06-20 DIAGNOSIS — Z87891 Personal history of nicotine dependence: Secondary | ICD-10-CM

## 2016-06-20 DIAGNOSIS — Z3A39 39 weeks gestation of pregnancy: Secondary | ICD-10-CM

## 2016-06-20 DIAGNOSIS — Z8249 Family history of ischemic heart disease and other diseases of the circulatory system: Secondary | ICD-10-CM

## 2016-06-20 LAB — CBC
HCT: 36.5 % (ref 36.0–46.0)
HCT: 33.4 % — ABNORMAL LOW (ref 36.0–46.0)
Hemoglobin: 12.4 g/dL (ref 12.0–15.0)
Hemoglobin: 11.3 g/dL — ABNORMAL LOW (ref 12.0–15.0)
MCH: 27 pg (ref 26.0–34.0)
MCH: 27 pg (ref 26.0–34.0)
MCHC: 34 g/dL (ref 30.0–36.0)
MCHC: 33.8 g/dL (ref 30.0–36.0)
MCV: 79.5 fL (ref 78.0–100.0)
MCV: 79.9 fL (ref 78.0–100.0)
PLATELETS: 201 10*3/uL (ref 150–400)
PLATELETS: 187 10*3/uL (ref 150–400)
RBC: 4.59 MIL/uL (ref 3.87–5.11)
RBC: 4.18 MIL/uL (ref 3.87–5.11)
RDW: 14.9 % (ref 11.5–15.5)
RDW: 15 % (ref 11.5–15.5)
WBC: 6.2 10*3/uL (ref 4.0–10.5)
WBC: 8.1 10*3/uL (ref 4.0–10.5)

## 2016-06-20 LAB — ABO/RH: ABO/RH(D): A POS

## 2016-06-20 LAB — TYPE AND SCREEN
ABO/RH(D): A POS
Antibody Screen: NEGATIVE

## 2016-06-20 LAB — RPR: RPR: NONREACTIVE

## 2016-06-20 MED ORDER — TETANUS-DIPHTH-ACELL PERTUSSIS 5-2.5-18.5 LF-MCG/0.5 IM SUSP: 0.5000 mL | Freq: Once | INTRAMUSCULAR | Status: DC

## 2016-06-20 MED ORDER — SOD CITRATE-CITRIC ACID 500-334 MG/5ML PO SOLN
30.0000 mL | ORAL | Status: DC | PRN
  Administered 2016-06-20: 30 mL via ORAL
  Filled 2016-06-20: qty 15

## 2016-06-20 MED ORDER — WITCH HAZEL-GLYCERIN EX PADS
1.0000 "application " | MEDICATED_PAD | CUTANEOUS | Status: DC | PRN
  Administered 2016-06-21: 1 via TOPICAL

## 2016-06-20 MED ORDER — PHENYLEPHRINE 40 MCG/ML (10ML) SYRINGE FOR IV PUSH (FOR BLOOD PRESSURE SUPPORT): 80.0000 ug | PREFILLED_SYRINGE | INTRAVENOUS | Status: DC | PRN

## 2016-06-20 MED ORDER — PHENYLEPHRINE 40 MCG/ML (10ML) SYRINGE FOR IV PUSH (FOR BLOOD PRESSURE SUPPORT)
80.0000 ug | PREFILLED_SYRINGE | INTRAVENOUS | Status: DC | PRN
  Filled 2016-06-20: qty 10
  Filled 2016-06-20: qty 5

## 2016-06-20 MED ORDER — OXYCODONE-ACETAMINOPHEN 5-325 MG PO TABS: 1.0000 | ORAL_TABLET | ORAL | Status: DC | PRN

## 2016-06-20 MED ORDER — OXYCODONE-ACETAMINOPHEN 5-325 MG PO TABS: 2.0000 | ORAL_TABLET | ORAL | Status: DC | PRN

## 2016-06-20 MED ORDER — LACTATED RINGERS IV SOLN: 500.0000 mL | INTRAVENOUS | Status: DC | PRN

## 2016-06-20 MED ORDER — COCONUT OIL OIL
1.0000 "application " | TOPICAL_OIL | Status: DC | PRN
  Administered 2016-06-21: 1 via TOPICAL
  Filled 2016-06-20: qty 120

## 2016-06-20 MED ORDER — PENICILLIN G POT IN DEXTROSE 60000 UNIT/ML IV SOLN
3.0000 10*6.[IU] | INTRAVENOUS | Status: DC
  Administered 2016-06-20: 3 10*6.[IU] via INTRAVENOUS
  Filled 2016-06-20 (×7): qty 50

## 2016-06-20 MED ORDER — ONDANSETRON HCL 4 MG/2ML IJ SOLN: 4.0000 mg | Freq: Four times a day (QID) | INTRAMUSCULAR | Status: DC | PRN

## 2016-06-20 MED ORDER — PHENYLEPHRINE 40 MCG/ML (10ML) SYRINGE FOR IV PUSH (FOR BLOOD PRESSURE SUPPORT)
80.0000 ug | PREFILLED_SYRINGE | INTRAVENOUS | Status: DC | PRN
  Filled 2016-06-20: qty 5

## 2016-06-20 MED ORDER — OXYTOCIN 40 UNITS IN LACTATED RINGERS INFUSION - SIMPLE MED
2.5000 [IU]/h | INTRAVENOUS | Status: DC
  Filled 2016-06-20: qty 1000

## 2016-06-20 MED ORDER — LACTATED RINGERS IV SOLN
500.0000 mL | Freq: Once | INTRAVENOUS | Status: AC
  Administered 2016-06-20: 500 mL via INTRAVENOUS

## 2016-06-20 MED ORDER — BENZOCAINE-MENTHOL 20-0.5 % EX AERO
1.0000 "application " | INHALATION_SPRAY | CUTANEOUS | Status: DC | PRN
  Administered 2016-06-20: 1 via TOPICAL
  Filled 2016-06-20 (×2): qty 56

## 2016-06-20 MED ORDER — FENTANYL 2.5 MCG/ML BUPIVACAINE 1/10 % EPIDURAL INFUSION (WH - ANES)
14.0000 mL/h | INTRAMUSCULAR | Status: DC | PRN
  Administered 2016-06-20: 14 mL/h via EPIDURAL
  Filled 2016-06-20: qty 100

## 2016-06-20 MED ORDER — LIDOCAINE HCL (PF) 1 % IJ SOLN
30.0000 mL | INTRAMUSCULAR | Status: DC | PRN
  Filled 2016-06-20: qty 30

## 2016-06-20 MED ORDER — OXYTOCIN BOLUS FROM INFUSION
500.0000 mL | Freq: Once | INTRAVENOUS | Status: AC
  Administered 2016-06-20: 500 mL via INTRAVENOUS

## 2016-06-20 MED ORDER — DIPHENHYDRAMINE HCL 50 MG/ML IJ SOLN: 12.5000 mg | INTRAMUSCULAR | Status: DC | PRN

## 2016-06-20 MED ORDER — LIDOCAINE HCL (PF) 1 % IJ SOLN
INTRAMUSCULAR | Status: DC | PRN
  Administered 2016-06-20 (×2): 6 mL via EPIDURAL

## 2016-06-20 MED ORDER — EPHEDRINE 5 MG/ML INJ
10.0000 mg | INTRAVENOUS | Status: DC | PRN
  Filled 2016-06-20: qty 4

## 2016-06-20 MED ORDER — ONDANSETRON HCL 4 MG/2ML IJ SOLN: 4.0000 mg | INTRAMUSCULAR | Status: DC | PRN

## 2016-06-20 MED ORDER — SENNOSIDES-DOCUSATE SODIUM 8.6-50 MG PO TABS
2.0000 | ORAL_TABLET | ORAL | Status: DC
  Administered 2016-06-20 – 2016-06-21 (×2): 2 via ORAL
  Filled 2016-06-20 (×2): qty 2

## 2016-06-20 MED ORDER — EPHEDRINE 5 MG/ML INJ: 10.0000 mg | INTRAVENOUS | Status: DC | PRN

## 2016-06-20 MED ORDER — LACTATED RINGERS IV SOLN: 500.0000 mL | Freq: Once | INTRAVENOUS | Status: DC

## 2016-06-20 MED ORDER — BUTORPHANOL TARTRATE 1 MG/ML IJ SOLN: 1.0000 mg | INTRAMUSCULAR | Status: DC | PRN

## 2016-06-20 MED ORDER — ACETAMINOPHEN 325 MG PO TABS: 650.0000 mg | ORAL_TABLET | ORAL | Status: DC | PRN

## 2016-06-20 MED ORDER — DIBUCAINE 1 % RE OINT
1.0000 "application " | TOPICAL_OINTMENT | RECTAL | Status: DC | PRN
  Administered 2016-06-21: 1 via RECTAL
  Filled 2016-06-20: qty 28

## 2016-06-20 MED ORDER — TERBUTALINE SULFATE 1 MG/ML IJ SOLN
0.2500 mg | Freq: Once | INTRAMUSCULAR | Status: DC | PRN
  Filled 2016-06-20: qty 1

## 2016-06-20 MED ORDER — ZOLPIDEM TARTRATE 5 MG PO TABS: 5.0000 mg | ORAL_TABLET | Freq: Every evening | ORAL | Status: DC | PRN

## 2016-06-20 MED ORDER — ONDANSETRON HCL 4 MG PO TABS: 4.0000 mg | ORAL_TABLET | ORAL | Status: DC | PRN

## 2016-06-20 MED ORDER — PRENATAL MULTIVITAMIN CH
1.0000 | ORAL_TABLET | Freq: Every day | ORAL | Status: DC
  Administered 2016-06-21: 1 via ORAL
  Filled 2016-06-20: qty 1

## 2016-06-20 MED ORDER — OXYCODONE HCL 5 MG PO TABS
5.0000 mg | ORAL_TABLET | ORAL | Status: DC | PRN
  Administered 2016-06-20 (×2): 10 mg via ORAL
  Administered 2016-06-21: 5 mg via ORAL
  Administered 2016-06-21: 10 mg via ORAL
  Administered 2016-06-21: 5 mg via ORAL
  Administered 2016-06-21 (×4): 10 mg via ORAL
  Administered 2016-06-22: 5 mg via ORAL
  Administered 2016-06-22: 10 mg via ORAL
  Filled 2016-06-20 (×3): qty 2
  Filled 2016-06-20 (×3): qty 1
  Filled 2016-06-20 (×5): qty 2

## 2016-06-20 MED ORDER — ACETAMINOPHEN 325 MG PO TABS
650.0000 mg | ORAL_TABLET | ORAL | Status: DC | PRN
  Administered 2016-06-20 – 2016-06-22 (×9): 650 mg via ORAL
  Filled 2016-06-20 (×9): qty 2

## 2016-06-20 MED ORDER — OXYTOCIN 40 UNITS IN LACTATED RINGERS INFUSION - SIMPLE MED
1.0000 m[IU]/min | INTRAVENOUS | Status: DC
  Administered 2016-06-20: 2 m[IU]/min via INTRAVENOUS

## 2016-06-20 MED ORDER — LACTATED RINGERS IV SOLN
INTRAVENOUS | Status: DC
  Administered 2016-06-20 (×2): via INTRAVENOUS

## 2016-06-20 MED ORDER — PENICILLIN G POTASSIUM 5000000 UNITS IJ SOLR
5.0000 10*6.[IU] | Freq: Once | INTRAVENOUS | Status: AC
  Administered 2016-06-20: 5 10*6.[IU] via INTRAVENOUS
  Filled 2016-06-20: qty 5

## 2016-06-20 MED ORDER — DIPHENHYDRAMINE HCL 25 MG PO CAPS: 25.0000 mg | ORAL_CAPSULE | Freq: Four times a day (QID) | ORAL | Status: DC | PRN

## 2016-06-20 MED ORDER — SIMETHICONE 80 MG PO CHEW: 80.0000 mg | CHEWABLE_TABLET | ORAL | Status: DC | PRN

## 2016-06-20 NOTE — Anesthesia Preprocedure Evaluation (Signed)
Anesthesia Evaluation  Patient identified by MRN, date of birth, ID band Patient awake    Reviewed: Allergy & Precautions, NPO status , Patient's Chart, lab work & pertinent test results  History of Anesthesia Complications Negative for: history of anesthetic complications  Airway Mallampati: III  TM Distance: >3 FB Neck ROM: Full    Dental  (+) Dental Advisory Given   Pulmonary former smoker,    breath sounds clear to auscultation       Cardiovascular negative cardio ROS   Rhythm:Regular Rate:Normal     Neuro/Psych negative neurological ROS     GI/Hepatic negative GI ROS, Neg liver ROS,   Endo/Other  negative endocrine ROS  Renal/GU negative Renal ROS     Musculoskeletal   Abdominal   Peds  Hematology  (+) Blood dyscrasia, , Pt with Von Willebrand's, has never had a clotting problem, followed by hematology, who says clotting factors are normal, so no DDAVP until post delivery plt 201K, pTT 28, VIII 216, VWb 191, VWb factor activity 140    Anesthesia Other Findings   Reproductive/Obstetrics (+) Pregnancy                             Anesthesia Physical Anesthesia Plan  ASA: III  Anesthesia Plan: Epidural   Post-op Pain Management:    Induction:   Airway Management Planned: Natural Airway  Additional Equipment:   Intra-op Plan:   Post-operative Plan:   Informed Consent: I have reviewed the patients History and Physical, chart, labs and discussed the procedure including the risks, benefits and alternatives for the proposed anesthesia with the patient or authorized representative who has indicated his/her understanding and acceptance.     Plan Discussed with:   Anesthesia Plan Comments: (Patient identified. Risks/Benefits/Options discussed with patient including but not limited to bleeding, infection, nerve damage, paralysis, failed block, incomplete pain control, headache,  blood pressure changes, nausea, vomiting, reactions to medication both or allergic, itching and postpartum back pain. Confirmed with bedside nurse the patient's most recent platelet count. Confirmed with patient that they are not currently taking any anticoagulation, Her bleeding history of VonWillebrand's is stable as per hematology. Patient expressed understanding of risk of epidural hematoma and wished to proceed, since she has had epidural analgesia in past without sequelae. All questions were answered. )        Anesthesia Quick Evaluation

## 2016-06-20 NOTE — Anesthesia Postprocedure Evaluation (Signed)
Anesthesia Post Note  Patient: Vanessa [name]  Procedure(s) Performed: * No procedures listed *  Patient location during evaluation: Mother Baby Anesthesia Type: Epidural Level of consciousness: awake and alert Pain management: pain level controlled Respiratory status: spontaneous breathing and nonlabored ventilation Cardiovascular status: stable Postop Assessment: no headache, patient able to bend at knees, no backache, no signs of nausea or vomiting, epidural receding and adequate PO intake Anesthetic complications: no        Last Vitals:  Vitals:   06/20/16 1545 06/20/16 1635  BP: 114/68 116/70  Pulse: 79   Resp: 18 18  Temp: 36.8 C 36.9 C    Last Pain:  Vitals:   06/20/16 1736  TempSrc:   PainSc: 3    Pain Goal: Patients Stated Pain Goal: 3 (06/20/16 1636)               Laban EmperorMalinova,Caral Whan Hristova

## 2016-06-20 NOTE — Lactation Note (Signed)
This note was copied from a baby's chart. Lactation Consultation Note  Patient Name: Girl Tobey BrideLisa Slatter ZOXWR'UToday's Date: 06/20/2016 Reason for consult: Initial assessment Baby at 3 hr of life. Mom reports bf is going well. She denies breast or nipple pain. She is worried about supply when she returns to work because she had a drop in supply with her daughter. Discussed baby behavior, feeding frequency, baby belly size, voids, wt loss, breast changes, and nipple care. She stated she can manually express and has spoon in room. Given lactation handouts. Aware of OP services and support group.    Maternal Data Has patient been taught Hand Expression?: Yes Does the patient have breastfeeding experience prior to this delivery?: Yes  Feeding Feeding Type: Breast Fed Length of feed: 30 min  LATCH Score/Interventions Latch: Grasps breast easily, tongue down, lips flanged, rhythmical sucking.  Audible Swallowing: A few with stimulation Intervention(s): Skin to skin  Type of Nipple: Everted at rest and after stimulation  Comfort (Breast/Nipple): Soft / non-tender     Hold (Positioning): Assistance needed to correctly position infant at breast and maintain latch.  LATCH Score: 8  Lactation Tools Discussed/Used WIC Program: No   Consult Status Consult Status: Follow-up Date: 06/21/16 Follow-up type: In-patient    Rulon Eisenmengerlizabeth E Yarexi Pawlicki 06/20/2016, 5:10 PM

## 2016-06-20 NOTE — Anesthesia Pain Management Evaluation Note (Signed)
  CRNA Pain Management Visit Note  Patient: Vanessa [name], 30 y.o., female  "Hello I am a [facility] of the anesthesia [facility]. We have an anesthesia team available at all times to provide care throughout the hospital, including epidural management and anesthesia for C-section. I don't know your plan for the delivery whether it a natural birth, water birth, IV sedation, nitrous supplementation, doula or epidural, but we want to meet your pain goals."   1.Was your pain managed to your expectations on prior hospitalizations?   Yes   2.What is your expectation for pain management during this hospitalization?     Epidural  3.How can we help you reach that goal? epidural  Record the patient's initial score and the patient's pain goal.   Pain: 4  Pain Goal: 5 [facility] wants you to be able to say your pain was always managed very well.  [name] 06/20/2016

## 2016-06-20 NOTE — Progress Notes (Signed)
Patient ID: Vanessa PeacockLisa C [name], female   DOB: 02-26-1986, 30 y.o.   MRN: [id number] Pt admitted and started on pitocin.  Feeling mild/moderate contractions afeb vss FHR Category 1  Cervix 3/70 per RN  PCN just starting Epidural desired, will likely get soon Plan AROM after epidural and PCN

## 2016-06-20 NOTE — Progress Notes (Signed)
Patient told not to get up out of bed without help. Plan of care expalined

## 2016-06-20 NOTE — Anesthesia Procedure Notes (Signed)
Epidural Patient location during procedure: OB Start time: 06/20/2016 9:37 AM End time: 06/20/2016 10:04 AM  Staffing Anesthesiologist: Jairo BenJACKSON, Kasidee Voisin Performed: anesthesiologist   Preanesthetic Checklist Completed: patient identified, surgical consent, pre-op evaluation, timeout performed, IV checked, risks and benefits discussed and monitors and equipment checked  Epidural Patient position: sitting Prep: site prepped and draped and DuraPrep Patient monitoring: blood pressure, continuous pulse ox and heart rate Approach: midline Location: L2-L3 Injection technique: LOR air  Needle:  Needle type: Tuohy  Needle gauge: 17 G Needle length: 9 cm Needle insertion depth: 5.5 cm Catheter type: closed end flexible Catheter size: 19 Gauge Catheter at skin depth: 11 cm Test dose: negative (1% lidocaine)  Assessment Events: blood not aspirated, injection not painful, no injection resistance, negative IV test and no paresthesia  Additional Notes Pt identified in Labor room.  Monitors applied. Working IV access confirmed. Sterile prep, drape lumbar spine.  1% lido local L 2,3.  #17ga Touhy LOR air at 5.5 cm L 2,3, cath in easily to 11cm skin. Test dose OK, cath dosed and infusion begun.  Patient asymptomatic, VSS, no heme aspirated, tolerated well.  Sandford Craze Dayquan Buys, MDReason for block:procedure for pain

## 2016-06-21 LAB — CBC
HCT: 30.8 % — ABNORMAL LOW (ref 36.0–46.0)
HEMOGLOBIN: 10.3 g/dL — AB (ref 12.0–15.0)
MCH: 26.9 pg (ref 26.0–34.0)
MCHC: 33.4 g/dL (ref 30.0–36.0)
MCV: 80.4 fL (ref 78.0–100.0)
PLATELETS: 159 10*3/uL (ref 150–400)
RBC: 3.83 MIL/uL — ABNORMAL LOW (ref 3.87–5.11)
RDW: 15.1 % (ref 11.5–15.5)
WBC: 6.7 10*3/uL (ref 4.0–10.5)

## 2016-06-21 NOTE — Progress Notes (Signed)
PPD #1 No problems, no heavy bleeding Afeb, VSS Fundus firm, NT at U-1 Continue routine postpartum care

## 2016-06-21 NOTE — Lactation Note (Signed)
This note was copied from a baby's chart. Lactation Consultation Note  Patient Name: Vanessa [name] ZOXWR'UToday's Date: 06/21/2016 Reason for consult: Follow-up assessment Baby at 26 hr of life. Mom is reporting bilateral sore nipples. The nipple and areola right around the base of nipple looks dark pink/red. Mom is afraid she is getting yeast because her nipples burn and "kind of itch". Mom has wide short nipples. With visual assessment, baby as a tight upper labial frenulum, baby can extended tongue over gum ridge, and lift tongue to roof. Mom has a noticeable lingual frenulum. Discussed some position options, reviewed nipple care, and gave comfort gels. Tried to latch baby at this visit but baby had just come off the breast. Left lactation number on the board for mom to call at the next feeding. Mom is agreeable to hold off on talking to MD about yeast until a latch assessment can be done.   Maternal Data    Feeding Feeding Type: Breast Fed Length of feed: 0 min  LATCH Score/Interventions Latch: Too sleepy or reluctant, no latch achieved, no sucking elicited.  Audible Swallowing: A few with stimulation  Type of Nipple: Everted at rest and after stimulation  Comfort (Breast/Nipple): Filling, red/small blisters or bruises, mild/mod discomfort  Problem noted: Mild/Moderate discomfort;Cracked, bleeding, blisters, bruises Interventions  (Cracked/bleeding/bruising/blister): Expressed breast milk to nipple Interventions (Mild/moderate discomfort): Comfort gels (coconut oil)  Hold (Positioning): No assistance needed to correctly position infant at breast.  LATCH Score: 8  Lactation Tools Discussed/Used     Consult Status Consult Status: Follow-up Date: 06/22/16 Follow-up type: In-patient    Rulon Eisenmengerlizabeth E Alyshia Kernan 06/21/2016, 4:38 PM

## 2016-06-21 NOTE — Progress Notes (Signed)
MOB was referred for history of depression/anxiety. * Referral screened out by Clinical Social Worker because none of the following criteria appear to apply: ~ History of anxiety/depression during this pregnancy, or of post-partum depression. ~ Diagnosis of anxiety and/or depression within last 3 years OR * MOB's symptoms currently being treated with medication and/or therapy.   See CSW noted dated for 01/23/14. MOB has a wealth of supports and no signs or symptoms of depression during this pregnancy.  CSW reviewed PPD with MOB and encouraged MOB to ask questions. CSW informed MOB of possible supports and interventions to decrease PPD.  CSW also encouraged MOB to seek medical attention if needed for increased signs and symptoms for PPD. CSW offered MOB outpatient counseling resources and MOB declined.    Please contact the Clinical Social Worker if needs arise, or if MOB requests.  Blaine HamperAngel [name], MSW, LCSW Clinical Social Work [phone]

## 2016-06-22 MED ORDER — OXYCODONE HCL 5 MG PO TABS: 5.0000 mg | ORAL_TABLET | ORAL | 0 refills | Status: AC | PRN

## 2016-06-22 NOTE — Discharge Summary (Addendum)
OB Discharge Summary     Patient Name: Vanessa PeacockLisa C [name] DOB: 03-24-1986 MRN: [id number]  Date of admission: 06/20/2016 Delivering MD: Huel CoteICHARDSON, KATHY   Date of discharge: 06/22/2016  Admitting diagnosis: INDUCTION Intrauterine pregnancy: 7317w6d     Secondary diagnosis:  Active Problems:   NSVD (normal spontaneous vaginal delivery)   Term pregnancy  Additional problems: none     Discharge diagnosis: Term Pregnancy Delivered                                                                                                Post partum procedures:none  Augmentation: AROM and Pitocin  Complications: None  Hospital course:  Induction of Labor With Vaginal Delivery   30 y.o. yo [phone] at 1617w6d was admitted to the hospital 06/20/2016 for induction of labor.  Indication for induction: Favorable cervix at term.  Patient had an uncomplicated labor course as follows: Membrane Rupture Time/Date: 12:52 PM ,06/20/2016   Intrapartum Procedures: Episiotomy: None [1]                                         Lacerations:  None [1]  Patient had delivery of a Viable infant.  Information for the patient's newborn:  Izora RibasMcAbee, Girl Misty StanleyLisa [[id number]][[id number]]  Delivery Method: Vaginal, Spontaneous Delivery (Filed from Delivery Summary)   06/20/2016  Details of delivery can be found in separate delivery note.  Patient had a routine postpartum course. Patient is discharged home 06/22/16.   Physical exam Vitals:   06/20/16 2050 06/21/16 0553 06/21/16 1736 06/22/16 0558  BP: 122/78 120/78 119/71 130/68  Pulse: 80 75 75 69  Resp: 18 18 18 18   Temp: 98.7 F (37.1 C) 98.4 F (36.9 C) 98.7 F (37.1 C) 98.5 F (36.9 C)  TempSrc: Oral Oral Oral Oral  SpO2:      Weight:      Height:       General: alert, cooperative and no distress Lochia: appropriate Uterine Fundus: firm Incision: N/A DVT Evaluation: No significant calf/ankle edema. Labs: Lab Results  Component Value Date   WBC 6.7 06/21/2016   HGB  10.3 (L) 06/21/2016   HCT 30.8 (L) 06/21/2016   MCV 80.4 06/21/2016   PLT 159 06/21/2016   CMP Latest Ref Rng & Units 02/17/2010  Glucose 70 - 99 mg/dL 71  BUN 6 - 23 mg/dL 12  Creatinine 0.4 - 1.2 mg/dL 0.5  Sodium 130135 - 865145 meq/L 139  Potassium 3.5 - 5.1 meq/L 4.1  Chloride 96 - 112 meq/L 107  CO2 19 - 32 meq/L 21  Calcium 8.4 - 10.5 mg/dL 9.1  Total Protein 6.0 - 8.3 g/dL 7.0  Total Bilirubin 0.3 - 1.2 mg/dL 0.6  Alkaline Phos 39 - 117 units/L 67  AST 0 - 37 units/L 15  ALT 0 - 35 units/L 10    Discharge instruction: per After Visit Summary and "Baby and Me Booklet".  After visit meds:  Allergies as of 06/22/2016   No Known Allergies  Medication List    TAKE these medications   acetaminophen 325 MG tablet Commonly known as:  TYLENOL Take 650 mg by mouth every 6 (six) hours as needed for mild pain.   calcium carbonate 500 MG chewable tablet Commonly known as:  TUMS - dosed in mg elemental calcium Chew 1 tablet by mouth as needed for indigestion or heartburn.   oxyCODONE 5 MG immediate release tablet Commonly known as:  Oxy IR/ROXICODONE Take 1 tablet (5 mg total) by mouth every 4 (four) hours as needed for moderate pain or severe pain.   prenatal multivitamin Tabs tablet Take 1 tablet by mouth daily at 12 noon.   ranitidine 150 MG tablet Commonly known as:  ZANTAC Take 150 mg by mouth as needed for heartburn.       Diet: routine diet  Activity: Advance as tolerated. Pelvic rest for 6 weeks.   Outpatient follow up:6 weeks Follow up Appt:Future Appointments Date Time Provider Department Center  08/14/2016 3:15 PM [facility] None  08/14/2016 3:30 PM Josph MachoPeter R Ennever, MD [facility] None   Follow up Visit:No Follow-up on file.  Postpartum contraception: Vasectomy  Newborn Data: Live born female  Birth Weight: 8 lb 4.5 oz (3755 g) APGAR: 9, 9  Baby Feeding: Breast Disposition:home with mother   06/22/2016 Edwinna Areolaecilia Worema Banga, DO

## 2016-06-22 NOTE — Progress Notes (Addendum)
Patient ID: Vanessa [name], female   DOB: 12-Dec-1985, 30 y.o.   MRN: [id number] Pt doing well. Has no complaints except sore coccyx. Lochia mild, pain controlled with meds. Ambulating and tolerating diet. Bonding well with baby- breastfeeding. Ready for discharge to home today VSS ABD- soft, FF EXT- no Homans  A/P: PPD#2 s/p svd- stable         Discharge to home today - instructions reviewed         Unsure BC option; maybe vasectomy for husband          F/u in office in 6 weeks

## 2016-06-22 NOTE — Discharge Instructions (Signed)
Nothing in vagina for 6 weeks.  No sex, tampons, and douching.  Other instructions as in [facility] Discharge Booklet. °

## 2016-06-22 NOTE — Lactation Note (Signed)
This note was copied from a baby's chart. Lactation Consultation Note  Patient Name: Vanessa Tobey BrideLisa [name] UJWJX'BToday's Date: 06/22/2016 Reason for consult: Follow-up assessment;Breast/nipple pain  Follow up visit made prior to discharge.  Mom c/o sore nipples and pain with feeding.  Both nipples pink and small abrasions noted on tips.  Baby is currently swaddled and sleeping.  Mom agreeable to feeding assist.  Baby unwrapped and woke easily.  Mom has been using cradle hold.  Instructed on cross cradle hold and reasoning for better control of latch.  Discussed using a nipple shield to possibly increase comfort until nipples heal.  Mom agreeable.  24 mm nipple shield applied.  Baby latched easily and nursed actively for 5 minutes before falling asleep.  A good amount of colostrum noted in shield.  Mom states pain improved slightly.  Discharge instructions given including engorgement treatment.  Reviewed outpatient support and services and encouraged to call prn.   Maternal Data Has patient been taught Hand Expression?: Yes  Feeding Feeding Type: Breast Fed Length of feed: 5 min  LATCH Score/Interventions Latch: Grasps breast easily, tongue down, lips flanged, rhythmical sucking. (with 24 mm nipple shield) Intervention(s): Teach feeding cues;Waking techniques Intervention(s): Adjust position;Assist with latch;Breast massage;Breast compression  Audible Swallowing: Spontaneous and intermittent Intervention(s): Alternate breast massage  Type of Nipple: Everted at rest and after stimulation  Comfort (Breast/Nipple): Filling, red/small blisters or bruises, mild/mod discomfort  Problem noted: Cracked, bleeding, blisters, bruises;Mild/Moderate discomfort Interventions (Mild/moderate discomfort): Comfort gels  Hold (Positioning): Assistance needed to correctly position infant at breast and maintain latch. Intervention(s): Breastfeeding basics reviewed;Support Pillows;Position options  LATCH Score:  8  Lactation Tools Discussed/Used     Consult Status Consult Status: Complete    Huston FoleyMOULDEN, Rhayne Chatwin S 06/22/2016, 9:32 AM

## 2016-08-14 ENCOUNTER — Ambulatory Visit: Payer: Commercial Managed Care - PPO | Admitting: Hematology & Oncology

## 2016-08-14 ENCOUNTER — Other Ambulatory Visit: Payer: Commercial Managed Care - PPO
